# Patient Record
Sex: Female | Born: 1990 | Hispanic: Yes | Marital: Married | Smoking: Never smoker
Health system: Southern US, Community
[De-identification: ages and names within clinical notes are randomized; demographics above are authoritative.]

## PROBLEM LIST (undated history)

## (undated) DIAGNOSIS — J45909 Unspecified asthma, uncomplicated: Secondary | ICD-10-CM

## (undated) DIAGNOSIS — D649 Anemia, unspecified: Secondary | ICD-10-CM

## (undated) DIAGNOSIS — K219 Gastro-esophageal reflux disease without esophagitis: Secondary | ICD-10-CM

## (undated) HISTORY — PX: KNEE CARTILAGE SURGERY: SHX688

---

## 2018-03-27 DIAGNOSIS — O09219 Supervision of pregnancy with history of pre-term labor, unspecified trimester: Secondary | ICD-10-CM

## 2018-03-27 DIAGNOSIS — O09899 Supervision of other high risk pregnancies, unspecified trimester: Secondary | ICD-10-CM

## 2018-03-28 DIAGNOSIS — O0933 Supervision of pregnancy with insufficient antenatal care, third trimester: Secondary | ICD-10-CM

## 2018-05-29 NOTE — L&D Delivery Note (Signed)
Patient: Angela Huber MRN: [id number]  GBS status: Negative, IAP given: None   Patient is a 28 y.o. now G4P3 s/p NSVD at [redacted]w[redacted]d, who was admitted for IOL for decreased fetal movement. AROM 0h 35m prior to delivery with clear fluid.   Was called to patient's room for recurrent deep variable decels. Ruptured patient with plan to place IUPC for amnioinfusion. Noted to be 8 cm. Had a prolonged decel to 60-70 bpm lasting 5 minutes. HR returned to baseline with hands and knees, O2, and discontinuation of Pitocin. Re-checked patient and she was complete.    Delivery Note At 11:22 PM a viable female was delivered via Vaginal, Spontaneous (Presentation: OA).  APGAR: 8, 9; weight pending.   Placenta status: spontaneous, intact.  Cord: 3 vessel with the following complications: none.    Anesthesia:  Epidural  Episiotomy: None Lacerations: None Suture Repair: None Est. Blood Loss (mL): 109  Head delivered OA. No nuchal cord present. Shoulder and body delivered in usual fashion. Infant with spontaneous cry, placed on mother's abdomen, dried and bulb suctioned. Cord clamped x 2 after 1-minute delay, and cut by family member. Cord blood drawn. Placenta delivered spontaneously with gentle cord traction. Fundus firm with massage and Pitocin. Perineum, vagina and labia inspected and found to have no lacerations.  Mom to postpartum.  Baby to Couplet care / Skin to Skin.  De Hollingshead 07/31/2018, 11:46 PM

## 2018-07-31 ENCOUNTER — Encounter (HOSPITAL_COMMUNITY): Payer: Self-pay | Admitting: *Deleted

## 2018-07-31 ENCOUNTER — Inpatient Hospital Stay (HOSPITAL_COMMUNITY)
Admission: EM | Admit: 2018-07-31 | Discharge: 2018-08-02 | DRG: 807 | Disposition: A | Discharge status: Home / Self Care | Payer: Medicaid Other | Attending: Obstetrics and Gynecology | Admitting: Obstetrics and Gynecology

## 2018-07-31 ENCOUNTER — Inpatient Hospital Stay (HOSPITAL_COMMUNITY): Payer: Medicaid Other | Admitting: Anesthesiology

## 2018-07-31 ENCOUNTER — Other Ambulatory Visit: Payer: Self-pay

## 2018-07-31 DIAGNOSIS — O368130 Decreased fetal movements, third trimester, not applicable or unspecified: Principal | ICD-10-CM | POA: Diagnosis present

## 2018-07-31 DIAGNOSIS — E669 Obesity, unspecified: Secondary | ICD-10-CM | POA: Diagnosis present

## 2018-07-31 DIAGNOSIS — O9902 Anemia complicating childbirth: Secondary | ICD-10-CM | POA: Diagnosis present

## 2018-07-31 DIAGNOSIS — O365930 Maternal care for other known or suspected poor fetal growth, third trimester, not applicable or unspecified: Secondary | ICD-10-CM | POA: Diagnosis present

## 2018-07-31 DIAGNOSIS — D649 Anemia, unspecified: Secondary | ICD-10-CM | POA: Diagnosis present

## 2018-07-31 DIAGNOSIS — O0933 Supervision of pregnancy with insufficient antenatal care, third trimester: Secondary | ICD-10-CM

## 2018-07-31 DIAGNOSIS — Z3A40 40 weeks gestation of pregnancy: Secondary | ICD-10-CM

## 2018-07-31 DIAGNOSIS — O09899 Supervision of other high risk pregnancies, unspecified trimester: Secondary | ICD-10-CM

## 2018-07-31 DIAGNOSIS — O99824 Streptococcus B carrier state complicating childbirth: Secondary | ICD-10-CM | POA: Diagnosis present

## 2018-07-31 DIAGNOSIS — O99214 Obesity complicating childbirth: Secondary | ICD-10-CM | POA: Diagnosis present

## 2018-07-31 DIAGNOSIS — O09219 Supervision of pregnancy with history of pre-term labor, unspecified trimester: Secondary | ICD-10-CM

## 2018-07-31 DIAGNOSIS — O368190 Decreased fetal movements, unspecified trimester, not applicable or unspecified: Secondary | ICD-10-CM | POA: Diagnosis present

## 2018-07-31 HISTORY — DX: Unspecified asthma, uncomplicated: J45.909

## 2018-07-31 HISTORY — DX: Anemia, unspecified: D64.9

## 2018-07-31 HISTORY — DX: Gastro-esophageal reflux disease without esophagitis: K21.9

## 2018-07-31 LAB — URINALYSIS, ROUTINE W REFLEX MICROSCOPIC
Bilirubin Urine: NEGATIVE
Glucose, UA: NEGATIVE mg/dL
Hgb urine dipstick: NEGATIVE
Ketones, ur: NEGATIVE mg/dL
Nitrite: NEGATIVE
Protein, ur: NEGATIVE mg/dL
Specific Gravity, Urine: 1.027 (ref 1.005–1.030)
pH: 5 (ref 5.0–8.0)

## 2018-07-31 LAB — CBC
HEMATOCRIT: 29 % — AB (ref 36.0–46.0)
HEMOGLOBIN: 8.9 g/dL — AB (ref 12.0–15.0)
MCH: 23.9 pg — ABNORMAL LOW (ref 26.0–34.0)
MCHC: 30.7 g/dL (ref 30.0–36.0)
MCV: 77.7 fL — AB (ref 80.0–100.0)
Platelets: 282 10*3/uL (ref 150–400)
RBC: 3.73 MIL/uL — ABNORMAL LOW (ref 3.87–5.11)
RDW: 17.4 % — ABNORMAL HIGH (ref 11.5–15.5)
WBC: 11.3 10*3/uL — ABNORMAL HIGH (ref 4.0–10.5)
nRBC: 0 % (ref 0.0–0.2)

## 2018-07-31 LAB — TYPE AND SCREEN
ABO/RH(D): O POS
Antibody Screen: NEGATIVE

## 2018-07-31 LAB — WET PREP, GENITAL
Clue Cells Wet Prep HPF POC: NONE SEEN
Sperm: NONE SEEN
Trich, Wet Prep: NONE SEEN
Yeast Wet Prep HPF POC: NONE SEEN

## 2018-07-31 LAB — HEMOGLOBIN A1C
Hgb A1c MFr Bld: 5.3 % (ref 4.8–5.6)
Mean Plasma Glucose: 105.41 mg/dL

## 2018-07-31 LAB — GROUP B STREP BY PCR: GROUP B STREP BY PCR: NEGATIVE

## 2018-07-31 LAB — ABO/RH: ABO/RH(D): O POS

## 2018-07-31 MED ORDER — OXYTOCIN 40 UNITS IN NORMAL SALINE INFUSION - SIMPLE MED
2.5000 [IU]/h | INTRAVENOUS | Status: DC
  Administered 2018-07-31: 2.5 [IU]/h via INTRAVENOUS
  Filled 2018-07-31: qty 1000

## 2018-07-31 MED ORDER — FENTANYL-BUPIVACAINE-NACL 0.5-0.125-0.9 MG/250ML-% EP SOLN
12.0000 mL/h | EPIDURAL | Status: DC | PRN
  Filled 2018-07-31: qty 250

## 2018-07-31 MED ORDER — LIDOCAINE HCL (PF) 1 % IJ SOLN: 30.0000 mL | INTRAMUSCULAR | Status: DC | PRN

## 2018-07-31 MED ORDER — PHENYLEPHRINE 40 MCG/ML (10ML) SYRINGE FOR IV PUSH (FOR BLOOD PRESSURE SUPPORT): 80.0000 ug | PREFILLED_SYRINGE | INTRAVENOUS | Status: DC | PRN

## 2018-07-31 MED ORDER — OXYTOCIN 40 UNITS IN NORMAL SALINE INFUSION - SIMPLE MED
1.0000 m[IU]/min | INTRAVENOUS | Status: DC
  Administered 2018-07-31: 2 m[IU]/min via INTRAVENOUS

## 2018-07-31 MED ORDER — SODIUM BICARBONATE 8.4 % IV SOLN
INTRAVENOUS | Status: DC | PRN
  Administered 2018-07-31: 2 mL via EPIDURAL
  Administered 2018-07-31: 5 mL via EPIDURAL

## 2018-07-31 MED ORDER — LACTATED RINGERS IV SOLN: 500.0000 mL | INTRAVENOUS | Status: DC | PRN

## 2018-07-31 MED ORDER — SODIUM CHLORIDE (PF) 0.9 % IJ SOLN
INTRAMUSCULAR | Status: DC | PRN
  Administered 2018-07-31: 12 mL/h via EPIDURAL

## 2018-07-31 MED ORDER — TERBUTALINE SULFATE 1 MG/ML IJ SOLN: 0.2500 mg | Freq: Once | INTRAMUSCULAR | Status: DC | PRN

## 2018-07-31 MED ORDER — EPHEDRINE 5 MG/ML INJ: 10.0000 mg | INTRAVENOUS | Status: DC | PRN

## 2018-07-31 MED ORDER — DIPHENHYDRAMINE HCL 50 MG/ML IJ SOLN: 12.5000 mg | INTRAMUSCULAR | Status: DC | PRN

## 2018-07-31 MED ORDER — ONDANSETRON HCL 4 MG/2ML IJ SOLN: 4.0000 mg | Freq: Four times a day (QID) | INTRAMUSCULAR | Status: DC | PRN

## 2018-07-31 MED ORDER — OXYTOCIN BOLUS FROM INFUSION
500.0000 mL | Freq: Once | INTRAVENOUS | Status: AC
  Administered 2018-07-31: 500 mL via INTRAVENOUS

## 2018-07-31 MED ORDER — FENTANYL CITRATE (PF) 100 MCG/2ML IJ SOLN: 100.0000 ug | INTRAMUSCULAR | Status: DC | PRN

## 2018-07-31 MED ORDER — FLEET ENEMA 7-19 GM/118ML RE ENEM: 1.0000 | ENEMA | RECTAL | Status: DC | PRN

## 2018-07-31 MED ORDER — LACTATED RINGERS IV SOLN
INTRAVENOUS | Status: DC
  Administered 2018-07-31: 16:00:00 via INTRAVENOUS

## 2018-07-31 MED ORDER — SOD CITRATE-CITRIC ACID 500-334 MG/5ML PO SOLN: 30.0000 mL | ORAL | Status: DC | PRN

## 2018-07-31 MED ORDER — LACTATED RINGERS IV SOLN: 500.0000 mL | Freq: Once | INTRAVENOUS | Status: DC

## 2018-07-31 NOTE — H&P (Addendum)
OBSTETRIC ADMISSION HISTORY AND PHYSICAL  Angela Huber is a 28 y.o. female [phone] with IUP at [redacted]w[redacted]d presenting for decreased fetal movement since 07/30/2018 @ 2000. She reports +Fms x3-4 since her arrival in MAU and was also having pain 8/10 in intensity. She had limited prenatal care in [place].  Patient states that she experienced heavy vaginal bleeding August-September of 2019. Reports bleeding through 2 pads every 2 hours. Patient states that she had an ultrasound in the emergency department in [place], but states they did not inform her that anything was wrong. She was given medication (vaginal progesterone) to prevent PTL. They stopped them 1 month ago. No LOF, VB, blurry vision, headaches, peripheral edema, or RUQ pain. She plans on breast and formula feeding. She requests Nexplanon for birth control.  Dating: By USS in the first trimester --->  Estimated Date of Delivery: 07/31/18  Sono:    @[redacted]w[redacted]d , CWD, normal anatomy, Vertex presentation, 1132g, 31%ile, EFW 2lb 8oz   Prenatal History/Complications: First Pregnancy SVD at term 6lb 11oz Second pregnancy was preterm at approx. 32 weeks patient states delivery was due to placental abruption. 2lb 8oz  Third pregnancy was a miscarriage at 15 weeks of unknown cause   Past Medical History: Past Medical History:  Diagnosis Date  . Anemia   . Asthma   . GERD (gastroesophageal reflux disease)     Past Surgical History: Past Surgical History:  Procedure Laterality Date  . KNEE CARTILAGE SURGERY Right     Obstetrical History: OB History    Gravida  4   Para  2   Term  1   Preterm  1   AB  1   Living  2     SAB  1   TAB      Ectopic      Multiple      Live Births  2           Social History: Social History   Socioeconomic History  . Marital status: Married    Spouse name: Not on file  . Number of children: Not on file  . Years of education: Not on file  . Highest education level: Not on file  Occupational  History  . Not on file  Social Needs  . Financial resource strain: Not on file  . Food insecurity:    Worry: Not on file    Inability: Not on file  . Transportation needs:    Medical: Not on file    Non-medical: Not on file  Tobacco Use  . Smoking status: Never Smoker  . Smokeless tobacco: Never Used  Substance and Sexual Activity  . Alcohol use: Not Currently  . Drug use: Never  . Sexual activity: Not on file  Lifestyle  . Physical activity:    Days per week: Not on file    Minutes per session: Not on file  . Stress: Not on file  Relationships  . Social connections:    Talks on phone: Not on file    Gets together: Not on file    Attends religious service: Not on file    Active member of club or organization: Not on file    Attends meetings of clubs or organizations: Not on file    Relationship status: Not on file  Other Topics Concern  . Not on file  Social History Narrative  . Not on file    Family History: No family history on file.  Allergies: Allergies  Allergen Reactions  .  Sulfa Antibiotics Hives    Medications Prior to Admission  Medication Sig Dispense Refill Last Dose  . docusate sodium (COLACE) 100 MG capsule Take daily to help with side effects from iron therapy     . Doxylamine-Pyridoxine 10-10 MG TBEC Take by mouth.     . ferrous sulfate 325 (65 FE) MG tablet Take by mouth.     Marland Kitchen PRENATAL 28-0.8 MG TABS Take by mouth.     . progesterone (PROMETRIUM) 200 MG capsule 200 mg nightly per vagina        Review of Systems   All systems reviewed and negative except as stated in HPI  Blood pressure 138/81, pulse 79, temperature 98.7 F (37.1 C), temperature source Oral, resp. rate 16, height 5\' 3"  (1.6 m), weight 99.5 kg, last menstrual period 09/27/2017, SpO2 100 %. General appearance: alert, cooperative and no distress Lungs: regular rate and effort Heart: regular rate  Abdomen: soft, non-tender Extremities: Homans sign is negative, no sign of  DVT Presentation: Vertex on Leopold Fetal monitoringBaseline: 130 bpm, Variability: Good {> 6 bpm) and Accelerations: Reactive Uterine activityNone     Prenatal labs: Prenatal labs under care everywhere  ABO, Rh:  O Positive  Antibody:   Negative Rubella:   2.26 (03/28/2018) RPR:   Non Reactive  HBsAg:   Negative  HIV:   Non reactive  GBS:   PCR negative 07/31/2018 2 hr GTT unknown  GC/C: Negative (11/23/2017)  Prenatal Transfer Tool  Maternal Diabetes: Unknown  Genetic Screening: None on file  Maternal Ultrasounds/Referrals: None  Fetal Ultrasounds or other Referrals:  None Maternal Substance Abuse:  No Significant Maternal Medications:  Meds include: Progesterone Significant Maternal Lab Results: None  Results for orders placed or performed during the hospital encounter of 07/31/18 (from the past 24 hour(s))  Urinalysis, Routine w reflex microscopic   Collection Time: 07/31/18  2:00 PM  Result Value Ref Range   Color, Urine YELLOW YELLOW   APPearance CLOUDY (A) CLEAR   Specific Gravity, Urine 1.027 1.005 - 1.030   pH 5.0 5.0 - 8.0   Glucose, UA NEGATIVE NEGATIVE mg/dL   Hgb urine dipstick NEGATIVE NEGATIVE   Bilirubin Urine NEGATIVE NEGATIVE   Ketones, ur NEGATIVE NEGATIVE mg/dL   Protein, ur NEGATIVE NEGATIVE mg/dL   Nitrite NEGATIVE NEGATIVE   Leukocytes,Ua MODERATE (A) NEGATIVE   WBC, UA 21-50 0 - 5 WBC/hpf   Bacteria, UA MANY (A) NONE SEEN   Squamous Epithelial / LPF 11-20 0 - 5   Mucus PRESENT   Wet prep, genital   Collection Time: 07/31/18  2:55 PM  Result Value Ref Range   Yeast Wet Prep HPF POC NONE SEEN NONE SEEN   Trich, Wet Prep NONE SEEN NONE SEEN   Clue Cells Wet Prep HPF POC NONE SEEN NONE SEEN   WBC, Wet Prep HPF POC MANY (A) NONE SEEN   Sperm NONE SEEN     Patient Active Problem List   Diagnosis Date Noted  . Decreased fetal movement 07/31/2018  . Obesity 07/31/2018  . Insufficient prenatal care in third trimester 03/28/2018  . History of  preterm delivery, currently pregnant 03/27/2018    Assessment: Angela Huber is a 28 y.o. [id number] at [redacted]w[redacted]d here for Decreased Fetal movement, with Hx of preterm delivery and miscarriage and GBS+ history stated by patient. USS confirm normal growth until 29 weeks but is currently measuring at 35cm at [redacted] wk GA by USS scan with EFW at last scan at 27 weeks in  the 31% and she also states Hx of GBS in her first pregnancy.  She currently has a negative GBS PCR. And there is non Trich or clue cells on wet prep just numerous WBCs.  IUGR Inadequate Prenatal care  Hx of prematurity.    1. Labor: Not in labour no contractions on toco.  2. FWB: Cat 1  3. Pain: None  4. GBS: PCR negative    Plan: Plan is to order CBC, RPR, HbA1c, Type and screen, UDS IOL do to term IUGR  Cervical check for bishop score (Pit vs Cytotec)   Sandi Raveling, MD  07/31/2018, 3:48 PM   I confirm that I have verified the information documented in the resident's note and that I have also personally reperformed the history, physical exam and all medical decision making activities of this service and have verified that all service and findings are accurately documented in this student's note.   Lab results reviewed under care everywhere from October 2019  GBS by PCR pending   Sharyon Cable, CNM 07/31/2018 7:33 PM

## 2018-07-31 NOTE — Progress Notes (Signed)
Labor Progress Note Angela Huber is a 28 y.o. female [phone] with IUP at [redacted]w[redacted]d for IOL presenting for decreased fetal movement since 07/30/2018 @ 2000. She reports +Fms x3-4 since her arrival in MAU and was also having pain 8/10 in intensity. She had limited prenatal care in [place] and on last anatomy USS showed SGA with EFW 1132g, 31% at 28+1 wk FH is currently 35cm small for dates.   S:  She is currently comfortable and is not having any HA, VB, LOF, and no RUQ pain and no peripheral edema. Not feeling any contractions   O:  BP 138/81 (BP Location: Left Arm)   Pulse 79   Temp 98.7 F (37.1 C) (Oral)   Resp 16   Ht 5\' 3"  (1.6 m)   Wt 99.5 kg   LMP 09/27/2017   SpO2 100%   BMI 38.85 kg/m  EFM: baseline 140 bpm/ Mod variability/ + accels/ - decels  Toco: Irritable  SVE: Dilation: 3.5 Effacement (%): 50 Station: -2 Presentation: Vertex Exam by:: Lennox Pippins resident  Pitocin: 2 mu/min  A/P:Angela Huber is a 28 y.o. female [phone] with IUP at [redacted]w[redacted]d GBS negative for IOL presenting for decreased fetal movement since 07/30/2018 @ 2000. She reports +Fms x3-4 since her arrival in MAU and was also having pain 8/10 in intensity. She had limited prenatal care in [place] and on last anatomy USS showed SGA with EFW 1132g, 31% at 28+1 wk FH 35 cm today small for dates  #SGA  USS showed SGA with EFW 1132g, 31% at 28+1 wk. She is currently at term and so we will go ahead and induce for delivery. FWB looks good at the moment and she has not labs currently that indicate any intrauterine infections that could cause growth restriction. There is possibility of having a placental factor be the culprit.   #Anemia: 8.9 Hgb with MCV of 77.7 fL Supplementation of iron after birth and then trend hemoglobin PP.  #Decreased fetal movement: This has since resolved and she is feeling active kicks and this was also felt on physical examination of patient.   1. Labor: Early latent  2. FWB: Cat 1 3. Pain: Mild    -Anticipate Vaginal delivery with induction of labour with favorable Bishop of 7 (3.5cm, 50%, -2) -Start pitocin low dose 2 mu/min to titrate by 2 mu/hr -Continue expectant management  -Serial cervical check at approx 2300hrs  Sandi Raveling, MD PGY-1 Family Medicine Resident [facility]  6:46 PM

## 2018-07-31 NOTE — MAU Note (Signed)
Started contracting yesterday, mild today q 15-62min.  "she has not been moving at all".  Hasn't felt any movement since last night.  No bleeding or leaking.  Moved here from [place] a wk ago.  Was on meds for PTL, vaginal insert- they stopped them a wk ago.

## 2018-07-31 NOTE — Anesthesia Procedure Notes (Signed)
Epidural Patient location during procedure: OB Start time: 07/31/2018 9:06 PM End time: 07/31/2018 9:19 PM  Staffing Anesthesiologist: Lucretia Kern, MD Performed: anesthesiologist   Preanesthetic Checklist Completed: patient identified, pre-op evaluation, timeout performed, IV checked, risks and benefits discussed and monitors and equipment checked  Epidural Patient position: sitting Prep: DuraPrep Patient monitoring: heart rate, continuous pulse ox and blood pressure Approach: midline Location: L3-L4 Injection technique: LOR air  Needle:  Needle type: Tuohy  Needle gauge: 17 G Needle length: 9 cm Needle insertion depth: 5.5 cm Catheter type: closed end flexible Catheter size: 19 Gauge Catheter at skin depth: 11 cm  Assessment Events: blood not aspirated, injection not painful, no injection resistance, negative IV test and no paresthesia  Additional Notes Reason for block:procedure for pain

## 2018-07-31 NOTE — MAU Note (Signed)
Pt did have vaginal bleeding during pregnancy that was heavy, no ultrasound done that she recalls and was never hospitalized for it. Will try to send for medical records.

## 2018-07-31 NOTE — Anesthesia Pain Management Evaluation Note (Signed)
  CRNA Pain Management Visit Note  Patient: Angela Huber, 28 y.o., female  "Hello I am a member of the anesthesia team at [facility]. We have an anesthesia team available at all times to provide care throughout the hospital, including epidural management and anesthesia for C-section. I don't know your plan for the delivery whether it a natural birth, water birth, IV sedation, nitrous supplementation, doula or epidural, but we want to meet your pain goals."   1.Was your pain managed to your expectations on prior hospitalizations?   Yes   2.What is your expectation for pain management during this hospitalization?     Epidural  3.How can we help you reach that goal? Maintain epidural until delivery of infant.  Record the patient's initial score and the patient's pain goal.   Pain: 0  Pain Goal: 0 [facility] wants you to be able to say your pain was always managed very well.  Ithzel Fedorchak 07/31/2018

## 2018-07-31 NOTE — Anesthesia Preprocedure Evaluation (Signed)
Anesthesia Evaluation  Patient identified by MRN, date of birth, ID band Patient awake    Reviewed: Allergy & Precautions, H&P , NPO status , Patient's Chart, lab work & pertinent test results  History of Anesthesia Complications Negative for: history of anesthetic complications  Airway Mallampati: II  TM Distance: >3 FB Neck ROM: full    Dental no notable dental hx.    Pulmonary asthma ,    Pulmonary exam normal        Cardiovascular negative cardio ROS Normal cardiovascular exam Rhythm:regular Rate:Normal     Neuro/Psych negative neurological ROS  negative psych ROS   GI/Hepatic Neg liver ROS, GERD  ,  Endo/Other  negative endocrine ROS  Renal/GU negative Renal ROS  negative genitourinary   Musculoskeletal   Abdominal   Peds  Hematology negative hematology ROS (+) Blood dyscrasia, anemia ,   Anesthesia Other Findings   Reproductive/Obstetrics (+) Pregnancy                             Anesthesia Physical Anesthesia Plan  ASA: II  Anesthesia Plan: Epidural   Post-op Pain Management:    Induction:   PONV Risk Score and Plan:   Airway Management Planned:   Additional Equipment:   Intra-op Plan:   Post-operative Plan:   Informed Consent: I have reviewed the patients History and Physical, chart, labs and discussed the procedure including the risks, benefits and alternatives for the proposed anesthesia with the patient or authorized representative who has indicated his/her understanding and acceptance.       Plan Discussed with:   Anesthesia Plan Comments:         Anesthesia Quick Evaluation

## 2018-07-31 NOTE — MAU Provider Note (Addendum)
History    CSN: [id number]  Arrival date and time: 07/31/18 1318   First Provider Initiated Contact with Patient 07/31/18 1413     History obtained from the patient.   Chief Complaint  Patient presents with  . Decreased Fetal Movement  . Contractions   HPI   Angela Huber is a 28 year old female [phone] who presents to MAU with a chief complaint of decreased fetal movement since 8:00 pm last night. Fetal heart tones were obtained in MAU (137 bmp). Patient has felt the baby move 3-4 times since she has been here. Patient also endorses constant suprapubic pain and pressure that she describes as a "cramp." She is unsure if she is experiencing contractions. Rates pain 8/10. Patient has not taken anything for the pain. Limited prenatal care in [place] (1 visit) Patient denies bleeding, leaking, or vaginal discharge. Patient is taking prenatal vitamins.   Patient states that she experienced heavy vaginal bleeding August-September of 2019. Reports bleeding through 2 pads every 2 hours. Patient states that she had an ultrasound in the emergency department in [place], but states they did not inform her that anything was wrong. She was given medication (vaginal insert) to prevent PTL. Patient can't recall the name of the medication They stopped them 1 month ago.    Patient had 1 term baby, 1 preterm baby (32 weeks; 2lbs 8 oz), and 1 miscarriage in early 2019.  OB History    Gravida  4   Para  2   Term  1   Preterm  1   AB  1   Living  2     SAB  1   TAB      Ectopic      Multiple      Live Births  2           Past Medical History:  Diagnosis Date  . Anemia   . Asthma   . GERD (gastroesophageal reflux disease)     Past Surgical History:  Procedure Laterality Date  . KNEE CARTILAGE SURGERY Right     No family history on file.  Social History   Tobacco Use  . Smoking status: Never Smoker  . Smokeless tobacco: Never Used  Substance Use Topics  . Alcohol use: Not  Currently  . Drug use: Never    Allergies:  Allergies  Allergen Reactions  . Sulfa Antibiotics Hives    No medications prior to admission.    Review of Systems  Constitutional: Negative for chills and fever.  Respiratory: Negative for shortness of breath.   Cardiovascular: Positive for leg swelling. Negative for chest pain.  Gastrointestinal: Positive for abdominal pain (suprapubic). Negative for constipation, diarrhea, nausea and vomiting.  Genitourinary: Negative for dysuria, vaginal bleeding, vaginal discharge and vaginal pain.  Neurological: Negative for dizziness and headaches.   Physical Exam   Blood pressure 128/78, pulse 84, temperature 99.1 F (37.3 C), temperature source Oral, resp. rate 16, height 5\' 3"  (1.6 m), weight 99.5 kg, SpO2 100 %.  Physical Exam  Constitutional: She is oriented to person, place, and time. She appears well-developed and well-nourished.  HENT:  Head: Normocephalic and atraumatic.  Neck: Normal range of motion. Neck supple.  Cardiovascular: Normal rate, regular rhythm and normal heart sounds.  Respiratory: Effort normal and breath sounds normal.  GI: Bowel sounds are normal. There is no abdominal tenderness. There is no guarding.  FH 35cm  Genitourinary:    Genitourinary Comments: SVE 3.5/60/-2, vtx   Musculoskeletal:  Normal range of motion.        General: Edema ( bilateral pitting edema 1+) present.  Neurological: She is alert and oriented to person, place, and time.  Skin: Skin is warm and dry.  Psychiatric: She has a normal mood and affect. Her behavior is normal. Judgment and thought content normal.  EFM: 135 bpm, mod variability, + accels, variable x1  Toco: irregular  Johney Maine PA-S2 07/31/2018, 2:14 PM  Results for orders placed or performed during the hospital encounter of 07/31/18 (from the past 24 hour(s))  Urinalysis, Routine w reflex microscopic     Status: Abnormal   Collection Time: 07/31/18  2:00 PM  Result  Value Ref Range   Color, Urine YELLOW YELLOW   APPearance CLOUDY (A) CLEAR   Specific Gravity, Urine 1.027 1.005 - 1.030   pH 5.0 5.0 - 8.0   Glucose, UA NEGATIVE NEGATIVE mg/dL   Hgb urine dipstick NEGATIVE NEGATIVE   Bilirubin Urine NEGATIVE NEGATIVE   Ketones, ur NEGATIVE NEGATIVE mg/dL   Protein, ur NEGATIVE NEGATIVE mg/dL   Nitrite NEGATIVE NEGATIVE   Leukocytes,Ua MODERATE (A) NEGATIVE   WBC, UA 21-50 0 - 5 WBC/hpf   Bacteria, UA MANY (A) NONE SEEN   Squamous Epithelial / LPF 11-20 0 - 5   Mucus PRESENT    MAU Course  Procedures  MDM Chart review: one PN visit (care everywhere). Records obtained that confirms EDC of 07/31/18 by 7w [place]. Ultrasound reports received confirm normal growth until 29 weeks but FH today is 35 cm. Given reports of no FM, limited, PNC, term gestation, and favorable cervix will admit for IOL. Discussed with Dr. Ashok Pall.  Assessment and Plan  [redacted] weeks gestation Reactive NST Admit to LD IOL Mngt per labor team

## 2018-08-01 ENCOUNTER — Encounter (HOSPITAL_COMMUNITY): Payer: Self-pay

## 2018-08-01 LAB — RUBELLA SCREEN: Rubella: 1.88 index (ref >0.99)

## 2018-08-01 LAB — GC/CHLAMYDIA PROBE AMP (~~LOC~~) NOT AT ARMC
Chlamydia: NEGATIVE
NEISSERIA GONORRHEA: NEGATIVE

## 2018-08-01 LAB — HIV ANTIBODY (ROUTINE TESTING W REFLEX): HIV Screen 4th Generation wRfx: NONREACTIVE

## 2018-08-01 LAB — RPR: RPR Ser Ql: NONREACTIVE

## 2018-08-01 MED ORDER — ONDANSETRON HCL 4 MG PO TABS: 4.0000 mg | ORAL_TABLET | ORAL | Status: DC | PRN

## 2018-08-01 MED ORDER — SIMETHICONE 80 MG PO CHEW: 80.0000 mg | CHEWABLE_TABLET | ORAL | Status: DC | PRN

## 2018-08-01 MED ORDER — ONDANSETRON HCL 4 MG/2ML IJ SOLN: 4.0000 mg | INTRAMUSCULAR | Status: DC | PRN

## 2018-08-01 MED ORDER — ACETAMINOPHEN 325 MG PO TABS: 650.0000 mg | ORAL_TABLET | ORAL | Status: DC | PRN

## 2018-08-01 MED ORDER — SENNOSIDES-DOCUSATE SODIUM 8.6-50 MG PO TABS
2.0000 | ORAL_TABLET | ORAL | Status: DC
  Administered 2018-08-01: 2 via ORAL
  Filled 2018-08-01: qty 2

## 2018-08-01 MED ORDER — PRENATAL MULTIVITAMIN CH
1.0000 | ORAL_TABLET | Freq: Every day | ORAL | Status: DC
  Administered 2018-08-01: 1 via ORAL
  Filled 2018-08-01: qty 1

## 2018-08-01 MED ORDER — TETANUS-DIPHTH-ACELL PERTUSSIS 5-2.5-18.5 LF-MCG/0.5 IM SUSP: 0.5000 mL | Freq: Once | INTRAMUSCULAR | Status: DC

## 2018-08-01 MED ORDER — IBUPROFEN 600 MG PO TABS
600.0000 mg | ORAL_TABLET | Freq: Four times a day (QID) | ORAL | Status: DC
  Administered 2018-08-01 (×4): 600 mg via ORAL
  Filled 2018-08-01 (×6): qty 1

## 2018-08-01 MED ORDER — DIBUCAINE 1 % RE OINT: 1.0000 "application " | TOPICAL_OINTMENT | RECTAL | Status: DC | PRN

## 2018-08-01 MED ORDER — ZOLPIDEM TARTRATE 5 MG PO TABS: 5.0000 mg | ORAL_TABLET | Freq: Every evening | ORAL | Status: DC | PRN

## 2018-08-01 MED ORDER — DIPHENHYDRAMINE HCL 25 MG PO CAPS: 25.0000 mg | ORAL_CAPSULE | Freq: Four times a day (QID) | ORAL | Status: DC | PRN

## 2018-08-01 MED ORDER — WITCH HAZEL-GLYCERIN EX PADS: 1.0000 "application " | MEDICATED_PAD | CUTANEOUS | Status: DC | PRN

## 2018-08-01 MED ORDER — MEASLES, MUMPS & RUBELLA VAC IJ SOLR: 0.5000 mL | Freq: Once | INTRAMUSCULAR | Status: DC

## 2018-08-01 MED ORDER — BENZOCAINE-MENTHOL 20-0.5 % EX AERO: 1.0000 "application " | INHALATION_SPRAY | CUTANEOUS | Status: DC | PRN

## 2018-08-01 MED ORDER — COCONUT OIL OIL: 1.0000 "application " | TOPICAL_OIL | Status: DC | PRN

## 2018-08-01 NOTE — Lactation Note (Signed)
This note was copied from a baby's chart. Lactation Consultation Note  Patient Name: Angela Huber OIZTI'W Date: 08/01/2018 Reason for consult: Initial assessment;Infant < 6lbs   P3, Baby 11 hours old. < 6 lbs. Mother first child < 3 lbs and she pumped for 6 mos and 2nd baby did not latch so she also pumped. Reviewed hand expression w/ good flow of colostrum.  Spoon fed baby 2 ml. Reviewed LPI volume feeding guidelines.  Recommend increasing per day of life and as baby desires. Baby recently received approx 15 ml of formula. Baby latched in cross cradle hold with intermittent sucks and swallows.] Encouraged mother to continue to pump w/ DEBP q 3 hours. Mom made aware of O/P services, breastfeeding support groups, community resources, and our phone # for post-discharge questions.       Maternal Data Has patient been taught Hand Expression?: Yes Does the patient have breastfeeding experience prior to this delivery?: Yes  Feeding Feeding Type: Breast Fed  LATCH Score Latch: Repeated attempts needed to sustain latch, nipple held in mouth throughout feeding, stimulation needed to elicit sucking reflex.  Audible Swallowing: A few with stimulation  Type of Nipple: Everted at rest and after stimulation  Comfort (Breast/Nipple): Soft / non-tender  Hold (Positioning): Assistance needed to correctly position infant at breast and maintain latch.  LATCH Score: 7  Interventions Interventions: Breast feeding basics reviewed;Assisted with latch;Skin to skin;Hand express;DEBP  Lactation Tools Discussed/Used     Consult Status Consult Status: Follow-up Date: 08/02/18 Follow-up type: In-patient    Dahlia Byes [facility] 08/01/2018, 11:08 AM

## 2018-08-01 NOTE — Lactation Note (Signed)
This note was copied from a baby's chart. Lactation Consultation Note  Patient Name: Angela Huber Today's Date: 08/01/2018   [facility] entered room to find mom and dad sleeping soundly in bed. Per [facility], baby in nursery under warmer for low temps.  Virgia Land 08/01/2018, 3:38 AM

## 2018-08-01 NOTE — Discharge Summary (Deleted)
Obstetric Discharge Summary Reason for Admission: onset of labor Prenatal Procedures: none Intrapartum Procedures: spontaneous vaginal delivery Postpartum Procedures: none Complications-Operative and Postpartum: none Hemoglobin  Date Value Ref Range Status  07/31/2018 8.9 (L) 12.0 - 15.0 g/dL Final    Comment:    Reticulocyte Hemoglobin testing may be clinically indicated, consider ordering this additional test [id number]    HCT  Date Value Ref Range Status  07/31/2018 29.0 (L) 36.0 - 46.0 % Final    Physical Exam:  General: alert, cooperative, appears stated age and no distress Lochia: appropriate Uterine Fundus: firm Incision: n/a DVT Evaluation: No evidence of DVT seen on physical exam. Negative Homan's sign. No cords or calf tenderness. No significant calf/ankle edema.  Discharge Diagnoses: Term Pregnancy-delivered and limited prenatal care  Discharge Information: Date: 08/01/2018 Activity: unrestricted Diet: routine Medications: None Condition: stable Instructions: refer to practice specific booklet Discharge to: home   Newborn Data: Live born female  Birth Weight: 5 lb 14.4 oz (2676 g) APGAR: 8, 9  Newborn Delivery   Birth date/time:  07/31/2018 23:22:00 Delivery type:  Vaginal, Spontaneous     Home with mother.  Brand Males, SNM 08/01/2018, 12:03 PM

## 2018-08-01 NOTE — H&P (Signed)
Mom came in as breast and bottle. Baby is less than 6lbs and needs to supplement with similca neosure 22. Mom request a bottle because she states that her other two girls did not latch on well and ended up bottlefeeding. RN encouraged mom to place baby on the breast before bottle feeding and educated of the possible consequences of bottle feeding.  The possible consequences shared with patient include 1) Loss of confidence in breastfeeding 2) Engorgement 3) Allergic sensitization of baby(asthma/allergies) and 4) decreased milk supply for mother. similac Neosure 22 given with proper instructions on the amount that should be given based on the baby's age.mom verbalized understanding.

## 2018-08-01 NOTE — Progress Notes (Addendum)
Post Partum Day 1 Subjective: no complaints, up ad lib, voiding, tolerating PO and + flatus  Objective: Blood pressure 122/70, pulse 71, temperature 98.6 F (37 C), temperature source Oral, resp. rate 16, height 5\' 3"  (1.6 m), weight 99.5 kg, last menstrual period 09/27/2017, SpO2 100 %, unknown if currently breastfeeding.  Physical Exam:  General: alert, cooperative, appears stated age and no distress Lochia: appropriate Uterine Fundus: firm Incision: n/a DVT Evaluation: No evidence of DVT seen on physical exam. Negative Homan's sign. No cords or calf tenderness. No significant calf/ankle edema.  Recent Labs    07/31/18 1611  HGB 8.9*  HCT 29.0*    Assessment/Plan: Plan for discharge tomorrow and Breastfeeding   LOS: 1 day   Angela Huber, SNM 08/01/2018, 1:42 PM    I confirm that I have verified the information documented in the nurse midwife student's note and that I have also personally performed the history, physical exam and all medical decision making activities of this service and have verified that all service and findings are accurately documented in this student's note.  Patient doing well, desires Nexplanon. Will send msg to clinic to make sure she has PP visit set up.   [name], CNM 08/01/2018 1:45 PM

## 2018-08-01 NOTE — Plan of Care (Signed)
Parent request formula to supplement breast feeding due to  inability of baby to sustain latch, mom worried not getting enough.  Parents have been informed of small tummy size of newborn, taught hand expression and understand the possible consequences of formula to the health of the infant. The possible consequences shared with patient include 1) Loss of confidence in breastfeeding 2) Engorgement 3) Allergic sensitization of baby(asthma/allergies) and 4) decreased milk supply for mother.After discussion of the above the mother decided to supplement with EBM [ set up with hand pump and DEBP ] and 22cal similac due to baby's low birth weight.The tool used to give formula supplement will be abottle with slow flow nipple. Mother request formula to supplement breast milk.  Mom now plans to pump and bottle feed and not put baby to breast. Supplies given and mom began pumping.

## 2018-08-01 NOTE — Anesthesia Postprocedure Evaluation (Signed)
Anesthesia Post Note  Patient: Angela Huber  Procedure(s) Performed: AN AD HOC LABOR EPIDURAL     Patient location during evaluation: Mother Baby Anesthesia Type: Epidural Level of consciousness: awake and alert Pain management: pain level controlled Vital Signs Assessment: post-procedure vital signs reviewed and stable Respiratory status: spontaneous breathing, nonlabored ventilation and respiratory function stable Cardiovascular status: stable Postop Assessment: no headache, no backache, epidural receding, patient able to bend at knees and no apparent nausea or vomiting Anesthetic complications: no    Last Vitals:  Vitals:   08/01/18 1000 08/01/18 1345  BP: 122/70 119/71  Pulse: 71 64  Resp: 16 16  Temp: 37 C 37 C  SpO2:      Last Pain:  Vitals:   08/01/18 1345  TempSrc: Oral  PainSc:    Pain Goal: Patients Stated Pain Goal: 2 (08/01/18 1000)                 Rica Records

## 2018-08-02 MED ORDER — IBUPROFEN 600 MG PO TABS: 600.0000 mg | ORAL_TABLET | Freq: Four times a day (QID) | ORAL | 0 refills | Status: DC

## 2018-08-02 MED ORDER — SENNOSIDES-DOCUSATE SODIUM 8.6-50 MG PO TABS: 2.0000 | ORAL_TABLET | ORAL | 0 refills | Status: AC

## 2018-08-02 MED ORDER — TETANUS-DIPHTH-ACELL PERTUSSIS 5-2.5-18.5 LF-MCG/0.5 IM SUSP: 0.5000 mL | Freq: Once | INTRAMUSCULAR | 0 refills | Status: AC

## 2018-08-02 NOTE — Discharge Instructions (Signed)

## 2018-08-02 NOTE — Lactation Note (Signed)
This note was copied from a baby's chart. Lactation Consultation Note  Patient Name: Girl Analiza Fien [id number] Date: 08/02/2018  Mom reports she feels prepared to go home.  This is her 3rd girl.  Mom reports her breastpump got delivered today so she will have a way to pump when she goes home. Urged to breastfeed first.  Pump and feed back all Expressed mothers milk due to infants weight/IUGR/SGA.  Urged mom to d/c pacifier past feedings and make sure infant not still hungry. Mom to give formula as prescribed or by moms choice.  Mom has lactation services information for discharge. Urged mom to call lactation as needed.   Maternal Data    Feeding Feeding Type: Bottle Fed - Formula  LATCH Score                   Interventions    Lactation Tools Discussed/Used     Consult Status      Konica Stankowski Michaelle Copas 08/02/2018, 11:43 AM

## 2018-08-02 NOTE — Discharge Summary (Addendum)
Postpartum Discharge Summary     Patient Name: Angela Huber DOB: [birth year] MRN: [id number]  Date of admission: 07/31/2018 Delivering Provider: Arvilla Market   Date of discharge: 08/02/2018  Admitting diagnosis: due date is today,CTX Intrauterine pregnancy: [redacted]w[redacted]d     Secondary diagnosis:  Active Problems:   History of preterm delivery, currently pregnant   Insufficient prenatal care in third trimester   Decreased fetal movement   Obesity  Additional problems: None     Discharge diagnosis: Term Pregnancy Delivered                                                                                                Post partum procedures:None  Augmentation: AROM and Pitocin  Complications: None  Hospital course:  Induction of Labor With Vaginal Delivery   28 y.o. yo [phone] at [redacted]w[redacted]d was admitted to the hospital 07/31/2018 for induction of labor.  Indication for induction: decreased fetal movements.  Patient had an uncomplicated labor course as follows: Membrane Rupture Time/Date: 11:00 PM ,07/31/2018   Intrapartum Procedures: Episiotomy: None [1]                                         Lacerations:  None [1]  Patient had delivery of a Viable infant.  Information for the patient's newborn:  Kaydra, Gellner [[id number]]  Delivery Method: Vaginal, Spontaneous(Filed from Delivery Summary)   07/31/2018  Details of delivery can be found in separate delivery note.  Patient had a routine postpartum course. Patient is discharged home 08/02/18.  Magnesium Sulfate recieved: No BMZ received: No  Physical exam  Vitals:   08/01/18 1000 08/01/18 1345 08/01/18 2225 08/02/18 0526  BP: 122/70 119/71 138/82 113/68  Pulse: 71 64 78 79  Resp: 16 16 18 18   Temp: 98.6 F (37 C) 98.6 F (37 C) 98.4 F (36.9 C) 98 F (36.7 C)  TempSrc: Oral Oral Oral   SpO2:    100%  Weight:      Height:       General: alert, cooperative and no distress Lochia: appropriate Uterine Fundus:  firm Incision: N/A DVT Evaluation: No evidence of DVT seen on physical exam. No cords or calf tenderness. Calf/Ankle edema is present Labs: Lab Results  Component Value Date   WBC 11.3 (H) 07/31/2018   HGB 8.9 (L) 07/31/2018   HCT 29.0 (L) 07/31/2018   MCV 77.7 (L) 07/31/2018   PLT 282 07/31/2018   No flowsheet data found.  Discharge instruction: per After Visit Summary and "Baby and Me Booklet".  After visit meds:  Allergies as of 08/02/2018      Reactions   Sulfa Antibiotics Hives      Medication List    TAKE these medications   ibuprofen 600 MG tablet Commonly known as:  ADVIL,MOTRIN Take 1 tablet (600 mg total) by mouth every 6 (six) hours.   prenatal multivitamin Tabs tablet Take 1 tablet by mouth daily at 12 noon.   senna-docusate 8.6-50 MG tablet Commonly known  as:  Senokot-S Take 2 tablets by mouth daily. Start taking on:  August 03, 2018   Tdap 5-2.5-18.5 LF-MCG/0.5 injection Commonly known as:  BOOSTRIX Inject 0.5 mLs into the muscle once for 1 dose.       Diet: low salt diet  Activity: Advance as tolerated. Pelvic rest for 6 weeks.   Outpatient follow up:4 weeks Follow up Appt:No future appointments. Follow up Visit: Follow-up Information    Center for [facility]. Schedule an appointment as soon as possible for a visit in 4 week(s).   Specialty:  Obstetrics and Gynecology Why:  for postpartum visit Contact information: [address] [place] [address] [phone]          Please schedule this patient for Postpartum visit in: 4 weeks with the following provider: Any provider For C/S patients schedule nurse incision check in weeks 2 weeks: no High risk pregnancy complicated by: Previous preterm delivery, history of placental abruption Delivery mode:  SVD Anticipated Birth Control:  Nexplanon PP Procedures needed: None  Schedule Integrated BH visit: no  Newborn Data: Live born female  Birth Weight: 5 lb  14.4 oz (2676 g) APGAR: 8, 9  Newborn Delivery   Birth date/time:  07/31/2018 23:22:00 Delivery type:  Vaginal, Spontaneous     Baby Feeding: Bottle and Breast Disposition:home with mother   08/02/2018 Joana Reamer, DO  OB FELLOW DISCHARGE ATTESTATION  I have seen and examined this patient and agree with above documentation in the resident's note.   Gwenevere Abbot, MD  OB Fellow  08/02/2018, 11:58 AM

## 2018-08-05 ENCOUNTER — Telehealth: Payer: Self-pay | Admitting: Family Medicine

## 2018-08-05 NOTE — Telephone Encounter (Signed)
Attempted to call patient to get her scheduled for her postpartum visit. No answer, LVM with appt time and date. Also mailed a reminder letter. Advised to give the office a call back if needing to reschedule.

## 2018-08-12 ENCOUNTER — Ambulatory Visit: Payer: Medicaid Other | Admitting: Obstetrics and Gynecology

## 2018-08-15 ENCOUNTER — Emergency Department (HOSPITAL_COMMUNITY)
Admission: EM | Admit: 2018-08-15 | Discharge: 2018-08-16 | Disposition: A | Discharge status: Other Acute Inpatient Hospital | Payer: Medicaid Other | Attending: Emergency Medicine | Admitting: Emergency Medicine

## 2018-08-15 DIAGNOSIS — F53 Postpartum depression: Secondary | ICD-10-CM | POA: Insufficient documentation

## 2018-08-15 DIAGNOSIS — J45909 Unspecified asthma, uncomplicated: Secondary | ICD-10-CM | POA: Insufficient documentation

## 2018-08-15 DIAGNOSIS — O99345 Other mental disorders complicating the puerperium: Secondary | ICD-10-CM

## 2018-08-15 DIAGNOSIS — R45851 Suicidal ideations: Secondary | ICD-10-CM | POA: Diagnosis not present

## 2018-08-15 LAB — RAPID URINE DRUG SCREEN, HOSP PERFORMED
Amphetamines: NOT DETECTED
Barbiturates: NOT DETECTED
Benzodiazepines: NOT DETECTED
Cocaine: NOT DETECTED
Opiates: NOT DETECTED
Tetrahydrocannabinol: NOT DETECTED

## 2018-08-15 LAB — I-STAT BETA HCG BLOOD, ED (MC, WL, AP ONLY)

## 2018-08-15 LAB — COMPREHENSIVE METABOLIC PANEL
ALT: 71 U/L — ABNORMAL HIGH (ref 0–44)
AST: 54 U/L — ABNORMAL HIGH (ref 15–41)
Albumin: 3.7 g/dL (ref 3.5–5.0)
Alkaline Phosphatase: 118 U/L (ref 38–126)
Anion gap: 8 (ref 5–15)
BUN: 14 mg/dL (ref 6–20)
CO2: 22 mmol/L (ref 22–32)
Calcium: 8.8 mg/dL — ABNORMAL LOW (ref 8.9–10.3)
Chloride: 111 mmol/L (ref 98–111)
Creatinine, Ser: 0.74 mg/dL (ref 0.44–1.00)
GFR calc Af Amer: >60 mL/min (ref >60)
GFR calc non Af Amer: >60 mL/min (ref >60)
Glucose, Bld: 90 mg/dL (ref 70–99)
Potassium: 3.6 mmol/L (ref 3.5–5.1)
Sodium: 141 mmol/L (ref 135–145)
Total Bilirubin: 1 mg/dL (ref 0.3–1.2)
Total Protein: 7.9 g/dL (ref 6.5–8.1)

## 2018-08-15 LAB — CBC
HCT: 34.8 % — ABNORMAL LOW (ref 36.0–46.0)
HEMOGLOBIN: 10.4 g/dL — AB (ref 12.0–15.0)
MCH: 24 pg — ABNORMAL LOW (ref 26.0–34.0)
MCHC: 29.9 g/dL — ABNORMAL LOW (ref 30.0–36.0)
MCV: 80.4 fL (ref 80.0–100.0)
Platelets: 339 10*3/uL (ref 150–400)
RBC: 4.33 MIL/uL (ref 3.87–5.11)
RDW: 16.8 % — ABNORMAL HIGH (ref 11.5–15.5)
WBC: 8.4 10*3/uL (ref 4.0–10.5)
nRBC: 0 % (ref 0.0–0.2)

## 2018-08-15 LAB — ACETAMINOPHEN LEVEL: Acetaminophen (Tylenol), Serum: <10 ug/mL — ABNORMAL LOW (ref 10–30)

## 2018-08-15 LAB — SALICYLATE LEVEL: Salicylate Lvl: <7 mg/dL (ref 2.8–30.0)

## 2018-08-15 LAB — ETHANOL: Alcohol, Ethyl (B): <10 mg/dL (ref <10)
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
## 2018-08-15 NOTE — ED Triage Notes (Signed)
Pt reports suicidal thought and plan.  Pt reports she gave birth two weeks ago, has happened w/ the first two children as well.  Husband brought wife in concerned, reports mood changes, anger bursts, tearful episodes.  Has thought about going to kitchen and grabbing a knife to hurt herself.  Is home a lot w/ the the three young children alone while spouse works.

## 2018-08-15 NOTE — ED Notes (Signed)
TTS in process 

## 2018-08-16 ENCOUNTER — Inpatient Hospital Stay (HOSPITAL_COMMUNITY)
Admission: AD | Admit: 2018-08-16 | Discharge: 2018-08-17 | DRG: 885 | Disposition: A | Discharge status: Home / Self Care | Payer: Medicaid Other | Source: Intra-hospital | Attending: Psychiatry | Admitting: Psychiatry

## 2018-08-16 ENCOUNTER — Encounter (HOSPITAL_COMMUNITY): Payer: Self-pay

## 2018-08-16 ENCOUNTER — Other Ambulatory Visit: Payer: Self-pay

## 2018-08-16 DIAGNOSIS — Z79899 Other long term (current) drug therapy: Secondary | ICD-10-CM

## 2018-08-16 DIAGNOSIS — D509 Iron deficiency anemia, unspecified: Secondary | ICD-10-CM | POA: Diagnosis present

## 2018-08-16 DIAGNOSIS — Z882 Allergy status to sulfonamides status: Secondary | ICD-10-CM

## 2018-08-16 DIAGNOSIS — F322 Major depressive disorder, single episode, severe without psychotic features: Secondary | ICD-10-CM | POA: Diagnosis present

## 2018-08-16 DIAGNOSIS — Z62810 Personal history of physical and sexual abuse in childhood: Secondary | ICD-10-CM | POA: Diagnosis present

## 2018-08-16 DIAGNOSIS — Z915 Personal history of self-harm: Secondary | ICD-10-CM

## 2018-08-16 DIAGNOSIS — R45851 Suicidal ideations: Secondary | ICD-10-CM | POA: Diagnosis present

## 2018-08-16 DIAGNOSIS — K219 Gastro-esophageal reflux disease without esophagitis: Secondary | ICD-10-CM | POA: Diagnosis present

## 2018-08-16 DIAGNOSIS — J45909 Unspecified asthma, uncomplicated: Secondary | ICD-10-CM | POA: Diagnosis present

## 2018-08-16 DIAGNOSIS — Z791 Long term (current) use of non-steroidal anti-inflammatories (NSAID): Secondary | ICD-10-CM

## 2018-08-16 MED ORDER — ACETAMINOPHEN 325 MG PO TABS
650.0000 mg | ORAL_TABLET | Freq: Four times a day (QID) | ORAL | Status: DC | PRN
  Administered 2018-08-16: 650 mg via ORAL
  Filled 2018-08-16: qty 2

## 2018-08-16 MED ORDER — ENSURE ENLIVE PO LIQD: 237.0000 mL | Freq: Two times a day (BID) | ORAL | Status: DC

## 2018-08-16 MED ORDER — ALUM & MAG HYDROXIDE-SIMETH 200-200-20 MG/5ML PO SUSP: 30.0000 mL | ORAL | Status: DC | PRN

## 2018-08-16 MED ORDER — ADULT MULTIVITAMIN W/MINERALS CH
1.0000 | ORAL_TABLET | Freq: Every day | ORAL | Status: DC
  Administered 2018-08-16: 1 via ORAL
  Filled 2018-08-16 (×5): qty 1

## 2018-08-16 MED ORDER — TRAZODONE HCL 50 MG PO TABS
50.0000 mg | ORAL_TABLET | Freq: Every evening | ORAL | Status: DC | PRN
  Filled 2018-08-16 (×6): qty 1

## 2018-08-16 MED ORDER — MAGNESIUM HYDROXIDE 400 MG/5ML PO SUSP: 30.0000 mL | Freq: Every day | ORAL | Status: DC | PRN

## 2018-08-16 MED ORDER — HYDROXYZINE HCL 25 MG PO TABS: 25.0000 mg | ORAL_TABLET | Freq: Four times a day (QID) | ORAL | Status: DC | PRN

## 2018-08-16 NOTE — Tx Team (Signed)
Interdisciplinary Treatment and Diagnostic Plan Update  08/16/2018 Time of Session:  Angela Huber MRN: [id number]  Principal Diagnosis: <principal problem not specified>  Secondary Diagnoses: Active Problems:   MDD (major depressive disorder), severe (HCC)   Current Medications:  Current Facility-Administered Medications  Medication Dose Route Frequency Provider Last Rate Last Dose  . acetaminophen (TYLENOL) tablet 650 mg  650 mg Oral Q6H PRN Laverle Hobby, PA-C      . alum & mag hydroxide-simeth (MAALOX/MYLANTA) 200-200-20 MG/5ML suspension 30 mL  30 mL Oral Q4H PRN Patriciaann Clan E, PA-C      . feeding supplement (ENSURE ENLIVE) (ENSURE ENLIVE) liquid 237 mL  237 mL Oral BID BM Cobos, Myer Peer, MD      . hydrOXYzine (ATARAX/VISTARIL) tablet 25 mg  25 mg Oral Q6H PRN Patriciaann Clan E, PA-C      . magnesium hydroxide (MILK OF MAGNESIA) suspension 30 mL  30 mL Oral Daily PRN Laverle Hobby, PA-C      . traZODone (DESYREL) tablet 50 mg  50 mg Oral QHS,MR X 1 Simon, Spencer E, PA-C       PTA Medications: Medications Prior to Admission  Medication Sig Dispense Refill Last Dose  . ibuprofen (ADVIL,MOTRIN) 600 MG tablet Take 1 tablet (600 mg total) by mouth every 6 (six) hours. (Patient not taking: Reported on 08/15/2018) 60 tablet 0 Completed Course at Unknown time  . senna-docusate (SENOKOT-S) 8.6-50 MG tablet Take 2 tablets by mouth daily. (Patient not taking: Reported on 08/15/2018) 30 tablet 0 Not Taking at Unknown time    Patient Stressors: Financial difficulties Marital or family conflict Other: 26 week old, 28 year old, 28 year old  Patient Strengths: Ability for insight Active sense of humor Average or above average intelligence Communication skills General fund of knowledge Physical Health Supportive family/friends  Treatment Modalities: Medication Management, Group therapy, Case management,  1 to 1 session with clinician, Psychoeducation, Recreational  therapy.   Physician Treatment Plan for Primary Diagnosis: <principal problem not specified> Long Term Goal(s):     Short Term Goals:    Medication Management: Evaluate patient's response, side effects, and tolerance of medication regimen.  Therapeutic Interventions: 1 to 1 sessions, Unit Group sessions and Medication administration.  Evaluation of Outcomes: Not Met  Physician Treatment Plan for Secondary Diagnosis: Active Problems:   MDD (major depressive disorder), severe ([facility])  Long Term Goal(s):     Short Term Goals:       Medication Management: Evaluate patient's response, side effects, and tolerance of medication regimen.  Therapeutic Interventions: 1 to 1 sessions, Unit Group sessions and Medication administration.  Evaluation of Outcomes: Not Met   RN Treatment Plan for Primary Diagnosis: <principal problem not specified> Long Term Goal(s): Knowledge of disease and therapeutic regimen to maintain health will improve  Short Term Goals: Ability to participate in decision making will improve, Ability to verbalize feelings will improve, Ability to disclose and discuss suicidal ideas and Ability to identify and develop effective coping behaviors will improve  Medication Management: RN will administer medications as ordered by provider, will assess and evaluate patient's response and provide education to patient for prescribed medication. RN will report any adverse and/or side effects to prescribing provider.  Therapeutic Interventions: 1 on 1 counseling sessions, Psychoeducation, Medication administration, Evaluate responses to treatment, Monitor vital signs and CBGs as ordered, Perform/monitor CIWA, COWS, AIMS and Fall Risk screenings as ordered, Perform wound care treatments as ordered.  Evaluation of Outcomes: Not Met   LCSW  Treatment Plan for Primary Diagnosis: <principal problem not specified> Long Term Goal(s): Safe transition to appropriate next level of care at  discharge, Engage patient in therapeutic group addressing interpersonal concerns.  Short Term Goals: Engage patient in aftercare planning with referrals and resources  Therapeutic Interventions: Assess for all discharge needs, 1 to 1 time with Social worker, Explore available resources and support systems, Assess for adequacy in community support network, Educate family and significant other(s) on suicide prevention, Complete Psychosocial Assessment, Interpersonal group therapy.  Evaluation of Outcomes: Not Met   Progress in Treatment: Attending groups: No. Participating in groups: No. Taking medication as prescribed: Yes. Toleration medication: Yes. Family/Significant other contact made: No, will contact:  if patient consents to collateral contacts Patient understands diagnosis: Yes. Discussing patient identified problems/goals with staff: Yes. Medical problems stabilized or resolved: Yes. Denies suicidal/homicidal ideation: Yes. Issues/concerns per patient self-inventory: No. Other:   New problem(s) identified:  New Short Term/Long Term Goal(s): medication stabilization, elimination of SI thoughts, development of comprehensive mental wellness plan.    Patient Goals:  Help with depression    Discharge Plan or Barriers: CSW will assess for appropriate referrals and possible discharge planning.    Reason for Continuation of Hospitalization: Anxiety Depression Medication stabilization Suicidal ideation  Estimated Length of Stay: 08/19/2018  Attendees: Patient: 08/16/2018 9:35 AM  Physician: Dr. Neita Garnet, MD 08/16/2018 9:35 AM  Nursing: Yetta Flock.Lenna Sciara RN 08/16/2018 9:35 AM  RN Care Manager: 08/16/2018 9:35 AM  Social Worker: Theresa Duty 08/16/2018 9:35 AM  Recreational Therapist:  08/16/2018 9:35 AM  Other: Harriett Sine, NP  08/16/2018 9:35 AM  Other:  08/16/2018 9:35 AM  Other: 08/16/2018 9:35 AM    Scribe for Treatment Team: Marylee Floras, [place] 08/16/2018 9:35  AM

## 2018-08-16 NOTE — Tx Team (Signed)
Initial Treatment Plan 08/16/2018 3:44 AM Lottie Dawson MOQ:[id number]    PATIENT STRESSORS: Financial difficulties Marital or family conflict Other: 71 week old, 28 year old, 28 year old   PATIENT STRENGTHS: Ability for insight Active sense of humor Average or above average intelligence Communication skills General fund of knowledge Physical Health Supportive family/friends   PATIENT IDENTIFIED PROBLEMS: "depression"                     DISCHARGE CRITERIA:  Improved stabilization in mood, thinking, and/or behavior Need for constant or close observation no longer present  PRELIMINARY DISCHARGE PLAN: Return to previous living arrangement  PATIENT/FAMILY INVOLVEMENT: This treatment plan has been presented to and reviewed with the patient, Navira Petrash.  The patient and family have been given the opportunity to ask questions and make suggestions.  Edwyna Perfect, RN 08/16/2018, 3:44 AM

## 2018-08-16 NOTE — H&P (Addendum)
Psychiatric Admission Assessment Adult  Patient Identification: Angela Huber MRN:  [id number] Date of Evaluation:  08/16/2018 Chief Complaint:  " I guess I just lost it for a few minutes " Principal Diagnosis: Suicidal Ideations Diagnosis:  Suicidal Ideations , consider Postpartum Depression History of Present Illness: 28 year old married female, two weeks post partum, presented to ED voluntarily, after she made a suicidal statement of wanting to kill herself with a knife .  Today states that this statement was made in anger during an argument with her husband .  Explains  that she had been planning a trip to [place], [place] , where her family resides . States this trip had been planned for a long time and was angered by her husband cancelling trip and " not letting me go alone either".  She denies any actual suicidal intent at this time, and states " I love my kids, I do not want to die, I just said it in anger". She does states she has had some mood swings recently, which she attributes to postpartum ,and to limited local family support as her family is in [place], but overall has been " doing OK". States " really , it was just a bad day, I've been fine"  States she has been mildly depressed over recent days ( not during pregnancy) , but denies having had any suicidal ideations or significant neuro-vegetative symptoms Associated Signs/Symptoms: Depression Symptoms: denies anhedonia or pervasive sense of sadness. States her sleep has been interrupted recently due to breastfeeding, caring for infant daughter, but that in total she does sleep 7 hours per 24 hour period, denies changes in appetite or energy, other than feeling tired at times. Denies any recent suicidal ideations. (Hypo) Manic Symptoms:  Denies  Anxiety Symptoms:  Denies panic attacks or increased  anxiety Psychotic Symptoms:  Denies  PTSD Symptoms: Denies  Total Time spent with patient: 45 minutes  Past Psychiatric History: no prior  psychiatric admissions, no past history of suicide attempts, remote history of self cutting at around age 57. Denies history of psychosis. Denies prior episodes of severe depression , states she had mild postpartum blues during prior pregnancies, but states " it was not bad, I was not really very depressed". Denies history of panic, agoraphobia. Denies history of PTSD, denies history of mania, denies history of violence .   Is the patient at risk to self? Yes.    Has the patient been a risk to self in the past 6 months? No.  Has the patient been a risk to self within the distant past? No.  Is the patient a risk to others? No.  Has the patient been a risk to others in the past 6 months? No.  Has the patient been a risk to others within the distant past? No.   Prior Inpatient Therapy:  denies  Prior Outpatient Therapy:  not currently   Alcohol Screening: 1. How often do you have a drink containing alcohol?: 2 to 3 times a week 2. How many drinks containing alcohol do you have on a typical day when you are drinking?: 3 or 4 3. How often do you have six or more drinks on one occasion?: Never AUDIT-C Score: 4 4. How often during the last year have you found that you were not able to stop drinking once you had started?: Never 5. How often during the last year have you failed to do what was normally expected from you becasue of drinking?: Never 6. How often  during the last year have you needed a first drink in the morning to get yourself going after a heavy drinking session?: Never 7. How often during the last year have you had a feeling of guilt of remorse after drinking?: Never 8. How often during the last year have you been unable to remember what happened the night before because you had been drinking?: Never 9. Have you or someone else been injured as a result of your drinking?: No 10. Has a relative or friend or a doctor or another health worker been concerned about your drinking or suggested  you cut down?: No Alcohol Use Disorder Identification Test Final Score (AUDIT): 4 Alcohol Brief Interventions/Follow-up: AUDIT Score <7 follow-up not indicated Substance Abuse History in the last 12 months:  Denies alcohol or drug abuse  Consequences of Substance Abuse: Denies  Previous Psychotropic Medications: States she was not taking any medications prior to admission and has not been on any psychiatric medication trials in the past either Psychological Evaluations:  No  Past Medical History: reports history of iron deficiency anemia, S/P  Full Term/Vaginal Delivery on 07/31/2018. Currently breastfeeding . Past Medical History:  Diagnosis Date  . Anemia   . Asthma   . GERD (gastroesophageal reflux disease)     Past Surgical History:  Procedure Laterality Date  . KNEE CARTILAGE SURGERY Right    Family History: Parents alive, live together, live in [place]. Has 5 siblings  Family Psychiatric  History: denies history of psychiatric illness in family, no suicides in family  Tobacco Screening: Have you used any form of tobacco in the last 30 days? (Cigarettes, Smokeless Tobacco, Cigars, and/or Pipes): No Social History: 27, married, three children , ages ( 86,64, and two week old). Children currently with her husband. Homemaker . Recently relocated from [place] to [place] in February  Social History   Substance and Sexual Activity  Alcohol Use Not Currently     Social History   Substance and Sexual Activity  Drug Use Never    Additional Social History: Marital status: Married Number of Years Married: 6 What types of issues is patient dealing with in the relationship?: Patient reports her husband works a lot and is not as supportive with their children, as he should be.  Additional relationship information: No  Are you sexually active?: Yes What is your sexual orientation?: Heterosexual  Has your sexual activity been affected by drugs, alcohol, medication, or emotional stress?: No  Does patient  have children?: Yes How many children?: 3 How is patient's relationship with their children?: Patient reports having a close relationship with her three children; ages, 51yo, 80yo, 67 week old daughters  Allergies:   Allergies  Allergen Reactions  . Sulfa Antibiotics Hives   Lab Results:  Results for orders placed or performed during the hospital encounter of 08/15/18 (from the past 48 hour(s))  Rapid urine drug screen (hospital performed)     Status: None   Collection Time: 08/15/18  8:58 PM  Result Value Ref Range   Opiates NONE DETECTED NONE DETECTED   Cocaine NONE DETECTED NONE DETECTED   Benzodiazepines NONE DETECTED NONE DETECTED   Amphetamines NONE DETECTED NONE DETECTED   Tetrahydrocannabinol NONE DETECTED NONE DETECTED   Barbiturates NONE DETECTED NONE DETECTED    Comment: (NOTE) DRUG SCREEN FOR MEDICAL PURPOSES ONLY.  IF CONFIRMATION IS NEEDED FOR ANY PURPOSE, NOTIFY LAB WITHIN 5 DAYS. LOWEST DETECTABLE LIMITS FOR URINE DRUG SCREEN Drug Class  Cutoff (ng/mL) Amphetamine and metabolites    1000 Barbiturate and metabolites    200 Benzodiazepine                 200 Tricyclics and metabolites     300 Opiates and metabolites        300 Cocaine and metabolites        300 THC                            50 Performed at [facility], [address], [place], [place] [address]   Comprehensive metabolic panel     Status: Abnormal   Collection Time: 08/15/18  9:11 PM  Result Value Ref Range   Sodium 141 135 - 145 mmol/L   Potassium 3.6 3.5 - 5.1 mmol/L   Chloride 111 98 - 111 mmol/L   CO2 22 22 - 32 mmol/L   Glucose, Bld 90 70 - 99 mg/dL   BUN 14 6 - 20 mg/dL   Creatinine, Ser 4.85 0.44 - 1.00 mg/dL   Calcium 8.8 (L) 8.9 - 10.3 mg/dL   Total Protein 7.9 6.5 - 8.1 g/dL   Albumin 3.7 3.5 - 5.0 g/dL   AST 54 (H) 15 - 41 U/L   ALT 71 (H) 0 - 44 U/L   Alkaline Phosphatase 118 38 - 126 U/L   Total Bilirubin 1.0 0.3 - 1.2 mg/dL   GFR calc non Af  Amer >60 >60 mL/min   GFR calc Af Amer >60 >60 mL/min   Anion gap 8 5 - 15    Comment: Performed at [facility], [address], [place], [place] [address]  Ethanol     Status: None   Collection Time: 08/15/18  9:11 PM  Result Value Ref Range   Alcohol, Ethyl (B) <10 <10 mg/dL    Comment: (NOTE) Lowest detectable limit for serum alcohol is 10 mg/dL. For medical purposes only. Performed at [facility], [address] [place], [place] [address]   Salicylate level     Status: None   Collection Time: 08/15/18  9:11 PM  Result Value Ref Range   Salicylate Lvl <7.0 2.8 - 30.0 mg/dL    Comment: Performed at [facility], [address] [place], [place] [address]  Acetaminophen level     Status: Abnormal   Collection Time: 08/15/18  9:11 PM  Result Value Ref Range   Acetaminophen (Tylenol), Serum <10 (L) 10 - 30 ug/mL    Comment: (NOTE) Therapeutic concentrations vary significantly. A range of 10-30 ug/mL  may be an effective concentration for many patients. However, some  are best treated at concentrations outside of this range. Acetaminophen concentrations >150 ug/mL at 4 hours after ingestion  and >50 ug/mL at 12 hours after ingestion are often associated with  toxic reactions. Performed at [facility], [address], [place], [place] [address]   cbc     Status: Abnormal   Collection Time: 08/15/18  9:11 PM  Result Value Ref Range   WBC 8.4 4.0 - 10.5 K/uL   RBC 4.33 3.87 - 5.11 MIL/uL   Hemoglobin 10.4 (L) 12.0 - 15.0 g/dL   HCT 69.6 (L) 78.9 - 38.1 %   MCV 80.4 80.0 - 100.0 fL   MCH 24.0 (L) 26.0 - 34.0 pg   MCHC 29.9 (L) 30.0 - 36.0 g/dL   RDW 01.7 (H) 51.0 - 25.8 %  Platelets 339 150 - 400 K/uL   nRBC 0.0 0.0 - 0.2 %    Comment: Performed at [facility], [address], [place], [place] [address]  I-Stat beta hCG blood, ED     Status: None   Collection Time: 08/15/18  9:22 PM  Result Value Ref Range   I-stat hCG, quantitative <5.0  <5 mIU/mL   Comment 3            Comment:   GEST. AGE      CONC.  (mIU/mL)   <=1 WEEK        5 - 50     2 WEEKS       50 - 500     3 WEEKS       100 - 10,000     4 WEEKS     1,000 - 30,000        FEMALE AND NON-PREGNANT FEMALE:     LESS THAN 5 mIU/mL     Blood Alcohol level:  Lab Results  Component Value Date   ETH <10 08/15/2018    Metabolic Disorder Labs:  Lab Results  Component Value Date   HGBA1C 5.3 07/31/2018   MPG 105.41 07/31/2018   No results found for: PROLACTIN No results found for: CHOL, TRIG, HDL, CHOLHDL, VLDL, LDLCALC  Current Medications: Current Facility-Administered Medications  Medication Dose Route Frequency Provider Last Rate Last Dose  . acetaminophen (TYLENOL) tablet 650 mg  650 mg Oral Q6H PRN Kerry Hough, PA-C      . alum & mag hydroxide-simeth (MAALOX/MYLANTA) 200-200-20 MG/5ML suspension 30 mL  30 mL Oral Q4H PRN Donell Sievert E, PA-C      . feeding supplement (ENSURE ENLIVE) (ENSURE ENLIVE) liquid 237 mL  237 mL Oral BID BM Cobos, Rockey Situ, MD      . hydrOXYzine (ATARAX/VISTARIL) tablet 25 mg  25 mg Oral Q6H PRN Donell Sievert E, PA-C      . magnesium hydroxide (MILK OF MAGNESIA) suspension 30 mL  30 mL Oral Daily PRN Kerry Hough, PA-C      . multivitamin with minerals tablet 1 tablet  1 tablet Oral Daily Cobos, Rockey Situ, MD      . traZODone (DESYREL) tablet 50 mg  50 mg Oral QHS,MR X 1 Simon, Spencer E, PA-C       PTA Medications: Medications Prior to Admission  Medication Sig Dispense Refill Last Dose  . ibuprofen (ADVIL,MOTRIN) 600 MG tablet Take 1 tablet (600 mg total) by mouth every 6 (six) hours. (Patient not taking: Reported on 08/15/2018) 60 tablet 0 Completed Course at Unknown time  . senna-docusate (SENOKOT-S) 8.6-50 MG tablet Take 2 tablets by mouth daily. (Patient not taking: Reported on 08/15/2018) 30 tablet 0 Not Taking at Unknown time    Musculoskeletal: Strength & Muscle Tone: within normal limits Gait & Station:  normal Patient leans: N/A  Psychiatric Specialty Exam: Physical Exam  Review of Systems  Constitutional: Negative.  Negative for chills and fever.  HENT: Negative.   Eyes: Negative.   Respiratory: Negative.   Cardiovascular: Negative.   Gastrointestinal: Negative for blood in stool, nausea and vomiting.  Genitourinary: Negative.   Musculoskeletal: Negative.   Skin: Negative.   Neurological: Negative for seizures and headaches.  Endo/Heme/Allergies: Negative.   Psychiatric/Behavioral: Positive for suicidal ideas.  All other systems reviewed and are negative.   Blood pressure 117/80, pulse 88, temperature 98.4 F (36.9 C), temperature source Oral, resp. rate 14, height 5'  3" (1.6 m), weight 87.1 kg, unknown if currently breastfeeding.Body mass index is 34.01 kg/m.  General Appearance: Well Groomed  Eye Contact:  Good  Speech:  Normal Rate  Volume:  Normal  Mood:  states that she feels "OK" today, does not currently present depressed   Affect:  appropriate, reactive  Thought Process:  Linear and Descriptions of Associations: Intact  Orientation:  Full (Time, Place, and Person)  Thought Content:  denies any hallucinations, no delusions   Suicidal Thoughts:  No denies suicidal or self injurious ideations at this time, denies any homicidal or violent ideations. Specifically denies any violent or homicidal ideations towards her child or towards her husband   Homicidal Thoughts:  No  Memory:  recent and remote grossly intact   Judgement:  Other:  improving   Insight:  fair   Psychomotor Activity:  Normal  Concentration:  Concentration: Good and Attention Span: Good  Recall:  Good  Fund of Knowledge:  Good  Language:  Good  Akathisia:  Negative  Handed:  Right  AIMS (if indicated):     Assets:  Communication Skills Desire for Improvement Resilience  ADL's:  Intact  Cognition:  WNL  Sleep:  Number of Hours: 2(new pt)    Treatment Plan Summary: Daily contact with patient to  assess and evaluate symptoms and progress in treatment, Medication management, Plan inpatient admission and medications as below  Observation Level/Precautions:  15 minute checks  Laboratory:  as needed -Patient's AST/ALT are slightly elevated.  No associated symptoms.  No known history of liver disease.?  Fatty liver.  She thinks she was tested for hepatitis C at some point in the past but is unsure.  She is agreeing to hep C/hep B testing.   Psychotherapy: milieu, group therapy     Medications:  We discussed options with patient- as above, states she feels her depression has been mild , and does not feel she needs to be on standing psychiatric medication at this time. Trazodone/ Vistaril PRNs for insomnia or anxiety as needed   Consultations:  As needed   Discharge Concerns:  -  Estimated LOS: 3-4 days   Other:     Physician Treatment Plan for Primary Diagnosis:  Suicidal Ideations  Long Term Goal(s): Improvement in symptoms so as ready for discharge  Short Term Goals: Ability to identify changes in lifestyle to reduce recurrence of condition will improve, Ability to verbalize feelings will improve, Ability to disclose and discuss suicidal ideas, Ability to demonstrate self-control will improve, Ability to identify and develop effective coping behaviors will improve and Ability to maintain clinical measurements within normal limits will improve  Physician Treatment Plan for Secondary Diagnosis: Suicidal Ideations  Long Term Goal(s): Improvement in symptoms so as ready for discharge  Short Term Goals: Ability to identify changes in lifestyle to reduce recurrence of condition will improve, Ability to verbalize feelings will improve, Ability to disclose and discuss suicidal ideas, Ability to demonstrate self-control will improve, Ability to identify and develop effective coping behaviors will improve and Ability to maintain clinical measurements within normal limits will improve  I certify that  inpatient services furnished can reasonably be expected to improve the patient's condition.    Craige Cotta, MD 3/20/202010:41 AM

## 2018-08-16 NOTE — ED Notes (Signed)
Patient has signed Vol.Consent form and has been faxed to [facility]

## 2018-08-16 NOTE — Progress Notes (Signed)
Recreation Therapy Notes  Date:  3.20.20 Time: 0930 Location: [address] Dayroom  Group Topic: Stress Management  Goal Area(s) Addresses:  Patient will identify positive stress management techniques. Patient will identify benefits of using stress management post d/c.  Intervention:  Stress Management   Activity :  Meditation.  LRT introduced patients to the stress management technique of meditation.  LRT played a meditation that focused on making the most of your day and the possibilities that can be had.  Patients were to follow along as meditation played to engage in activity.   Education:  Stress Management, Discharge Planning.   Education Outcome: Acknowledges Education  Clinical Observations/Feedback: Pt did not attend group.      Kenzlei Runions, LRT/CTRS         Adryanna Friedt A 08/16/2018 11:06 AM 
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
## 2018-08-16 NOTE — BHH Counselor (Signed)
Per Caryl Ada, RN pt has ben accepted to [facility] and assigned to room/bed: 403-1. Pt can come now. Accepting physician: Donell Sievert, PA. Attending physician: Dr. Jama Flavors. Nursing report: [phone]. Voluntary paperwork to be faxed. Updated disposition dicussed with Gabriel Rung, RN.    Redmond Pulling, MS, [facility], [facility] Triage Specialist [phone]

## 2018-08-16 NOTE — ED Notes (Signed)
Pt has called spouse to inform of impending transfer to [facility] tonight-Monique,RN

## 2018-08-16 NOTE — Progress Notes (Signed)
Per report from [facility] patient having SI with plan to grab a kitchen knife and patient was brought into the ED by her husband. Per report patient has a history of postpartum depression. Patient is 2 weeks postpartum with a baby girl and also has two other daughters (85 and 26 years old). Maris denies SI, HI, AVH, and contracts for safety. At first she denies having thoughts of hurting herself at all but then comments that she told the ED nurse how she was having thoughts of killing herself with a kitchen knife and these thoughts have been since the birth of her daughter two weeks ago. Patient endorses feeling depressed and crying spells. She reports that her stressors are having three young children and financial concerns. Her supports system is her husband. The family moved from [place], [place] on February 22nd to live with the husbands mother. Patient reports not liking her husbands mother. Patient reports social alcohol use, denies tobacco, denies drug use, and denies history of/current abuse. Pippa reports a history of self harm in the form of cutting when she was 28 years old. Patient is a low fall risk, is not on any prescription medications, and is currently lactating/breast feeding her two week old. Patient would like a breast pump if possible. Maryum reports she is allergic to sulfa antibiotics, was told by her doctor that she is anemic, and has lost over 20 pounds in the past two weeks. She denies poor appetite but reports only eating once a day. Rights and responsibilities described to patient and she is oriented to the unit. Patient offered food and drink. Patient only wanted water to drink before going to her room.

## 2018-08-16 NOTE — Progress Notes (Addendum)
NUTRITION ASSESSMENT  Pt identified as at risk on the Malnutrition Screen Tool  INTERVENTION: - Continue Ensure Enlive BID, each supplement provides 350 kcal and 20 grams of protein. - Will order daily multivitamin with minerals. - Continue to encourage PO intakes.   NUTRITION DIAGNOSIS: Increased nutrient needs related to post-partum as evidenced by estimated nutrition needs.  Goal: Pt to meet >/= 90% of their estimated nutrition needs.  Monitor:  PO intake  Assessment:   Patient admitted for severe depression and SI. Notes indicate that patient gave birth 2 weeks PTA. Notes also state that patient has given birth twice in the past and that current symptoms also occurred after both of those births.  Per chart review, current weight is 192 lb and weight on 3/4 (around the time that patient gave birth) was 219 lb.     28 y.o. female  Height: Ht Readings from Last 1 Encounters:  08/16/18 5\' 3"  (1.6 m)    Weight: Wt Readings from Last 1 Encounters:  08/16/18 87.1 kg    Weight Hx: Wt Readings from Last 10 Encounters:  08/16/18 87.1 kg  08/15/18 89.4 kg  07/31/18 99.5 kg    BMI:  Body mass index is 34.01 kg/m. Pt meets criteria for obesity based on current BMI.  Estimated Nutritional Needs: Kcal: 30-35 kcal/kg Protein: > 1.5 gram protein/kg Fluid: 1 ml/kcal  Diet Order:  Diet Order            Diet regular Room service appropriate? Yes; Fluid consistency: Thin  Diet effective now             Pt is also offered choice of unit snacks mid-morning and mid-afternoon.  Pt is eating as desired.   Lab results and medications reviewed.      Trenton Gammon, MS, RD, LDN, [facility] Inpatient Clinical Dietitian Pager # [phone] After hours/weekend pager # [phone]

## 2018-08-16 NOTE — BHH Suicide Risk Assessment (Signed)
[facility] Admission Suicide Risk Assessment   Nursing information obtained from:  Patient Demographic factors:  Adolescent or young adult, Low socioeconomic status, Unemployed Current Mental Status:  NA Loss Factors:  Financial problems / change in socioeconomic status Historical Factors:  NA Risk Reduction Factors:  Responsible for children under 26 years of age, Sense of responsibility to family, Living with another person, especially a relative  Total Time spent with patient: 45 minutes Principal Problem: Suicidal ideations Diagnosis:  Active Problems:   MDD (major depressive disorder), severe (HCC)  Subjective Data:   Continued Clinical Symptoms:  Alcohol Use Disorder Identification Test Final Score (AUDIT): 4 The "Alcohol Use Disorders Identification Test", Guidelines for Use in Primary Care, Second Edition.  World Science writer [facility]). Score between 0-7:  no or low risk or alcohol related problems. Score between 8-15:  moderate risk of alcohol related problems. Score between 16-19:  high risk of alcohol related problems. Score 20 or above:  warrants further diagnostic evaluation for alcohol dependence and treatment.   CLINICAL FACTORS:  28 year old female, 2 weeks postpartum, presented to ED after making a suicidal statement she wanted to kill herself.  Patient states that this statement was made during an argument with her husband, and states she said it in frustration/anger, without any real plan or intention.  She reports some mild depression without significant neurovegetative symptoms or any suicidal ideations recently.  Of note, also denies any violent or homicidal ideations towards her husband or children.  At this time is future oriented, looking forward to see her children and to a trip to [place] to see her family.    Psychiatric Specialty Exam: Physical Exam  ROS  Blood pressure 117/80, pulse 88, temperature 98.4 F (36.9 C), temperature source Oral, resp. rate  14, height 5\' 3"  (1.6 m), weight 87.1 kg, unknown if currently breastfeeding.Body mass index is 34.01 kg/m.  See admit note MSE    COGNITIVE FEATURES THAT CONTRIBUTE TO RISK:  Closed-mindedness and Loss of executive function    SUICIDE RISK:   Moderate:  Frequent suicidal ideation with limited intensity, and duration, some specificity in terms of plans, no associated intent, good self-control, limited dysphoria/symptomatology, some risk factors present, and identifiable protective factors, including available and accessible social support.  PLAN OF CARE: Patient will be admitted to inpatient psychiatric unit for stabilization and safety. Will provide and encourage milieu participation. Provide medication management and maked adjustments as needed.  Will follow daily.    I certify that inpatient services furnished can reasonably be expected to improve the patient's condition.   Craige Cotta, MD 08/16/2018, 11:18 AM

## 2018-08-16 NOTE — BHH Suicide Risk Assessment (Signed)
BHH INPATIENT:  Family/Significant Other Suicide Prevention Education  Suicide Prevention Education:  Education Completed; with husband, Angela Huber [phone])  has been identified by the patient as the family member/significant other with whom the patient will be residing, and identified as the person(s) who will aid the patient in the event of a mental health crisis (suicidal ideations/suicide attempt).  With written consent from the patient, the family member/significant other has been provided the following suicide prevention education, prior to the and/or following the discharge of the patient.  The suicide prevention education provided includes the following:  Suicide risk factors  Suicide prevention and interventions  National Suicide Hotline telephone number  [facility] assessment telephone number  [facility] Emergency Assistance 911  [facility] and/or Residential Mobile Crisis Unit telephone number  Request made of family/significant other to:  Remove weapons (e.g., guns, rifles, knives), all items previously/currently identified as safety concern.    Remove drugs/medications (over-the-counter, prescriptions, illicit drugs), all items previously/currently identified as a safety concern.  The family member/significant other verbalizes understanding of the suicide prevention education information provided.  The family member/significant other agrees to remove the items of safety concern listed above.  Patient's husband reports that he brought his wife to the hospital "just to be sure". He reports that his wife has struggled with depression since having their youngest daughter two weeks ago. He also shared that he and the patient recently moved from [place] one month ago and that the patient misses her family. The patient's husband states that he does not have any concerns with the patient's safety at discharge and that he does not believe she would harm  herself or anyone else. CSW will continue to follow.   Angela Huber 08/16/2018, 11:20 AM

## 2018-08-16 NOTE — Plan of Care (Addendum)
Patient self inventory- Patient slept fair last night, sleep medication was not requested. Appetite is good, energy level normal, and concentration good. Depression, hopelessness, and anxiety all rated 0/10. Denies SI HI AVH. Denies physical pain. Patient wrote, "My baby is 2 weeks and I need to go home and be with her because her dad can't handle three kids. Can I go home and be with my baby. I never want to hurt myself. I want to be there for my girls." Patient safety is maintained with 15 minute checks as well as environmental checks. Will continue to monitor and provide support.   Problem: Education: Goal: Emotional status will improve Outcome: Progressing Goal: Mental status will improve Outcome: Progressing Goal: Verbalization of understanding the information provided will improve Outcome: Progressing

## 2018-08-16 NOTE — ED Provider Notes (Signed)
[facility] EMERGENCY DEPARTMENT Provider Note   CSN: [id number] Arrival date & time: 08/15/18  2012    History   Chief Complaint Chief Complaint  Patient presents with  . Suicidal    HPI Angela Huber is a 28 y.o. female.     HPI   28 year old female with history of vaginal delivery 07/31/2018, presents with concern for depression and suicidal ideation.  Reports that she had postpartum depression after the birth of her other 2 kids.  She says she began to feel depressed approximately 4 days prior to the child, Tonna Corner, being born and depression really worsened 1 week postpartum.  Reports she has had episodes of uncontrollable tearfulness, suicidal ideation. Reports she will stand in the shower and think about all the ways she could kill herself without her family finding out and also reported thoughts of grabbing a knife to stab herself.  Husband brought her in due to concerns regarding her tearful episodes and mood changes.   Past Medical History:  Diagnosis Date  . Anemia   . Asthma   . GERD (gastroesophageal reflux disease)     Patient Active Problem List   Diagnosis Date Noted  . Decreased fetal movement 07/31/2018  . Obesity 07/31/2018  . Insufficient prenatal care in third trimester 03/28/2018  . History of preterm delivery, currently pregnant 03/27/2018    Past Surgical History:  Procedure Laterality Date  . KNEE CARTILAGE SURGERY Right      OB History    Gravida  4   Para  3   Term  2   Preterm  1   AB  1   Living  3     SAB  1   TAB      Ectopic      Multiple  0   Live Births  3            Home Medications    Prior to Admission medications   Medication Sig Start Date End Date Taking? Authorizing Provider  ibuprofen (ADVIL,MOTRIN) 600 MG tablet Take 1 tablet (600 mg total) by mouth every 6 (six) hours. Patient not taking: Reported on 08/15/2018 08/02/18   Orpah Cobb P, DO  senna-docusate (SENOKOT-S) 8.6-50 MG  tablet Take 2 tablets by mouth daily. Patient not taking: Reported on 08/15/2018 08/03/18   Joana Reamer, DO    Family History No family history on file.  Social History Social History   Tobacco Use  . Smoking status: Never Smoker  . Smokeless tobacco: Never Used  Substance Use Topics  . Alcohol use: Not Currently  . Drug use: Never     Allergies   Sulfa antibiotics   Review of Systems Review of Systems  Constitutional: Negative for fever.  HENT: Negative for sore throat.   Eyes: Negative for visual disturbance.  Respiratory: Negative for cough and shortness of breath.   Cardiovascular: Negative for chest pain.  Gastrointestinal: Negative for abdominal pain.  Genitourinary: Negative for difficulty urinating.  Musculoskeletal: Negative for back pain and neck pain.  Skin: Negative for rash.  Neurological: Negative for syncope and headaches.     Physical Exam Updated Vital Signs BP 117/81 (BP Location: Right Arm)   Pulse 85   Temp 98.7 F (37.1 C) (Oral)   Resp 16   Ht 5\' 3"  (1.6 m)   Wt 89.4 kg   SpO2 100%   BMI 34.90 kg/m   Physical Exam Constitutional:      Appearance: Normal appearance.  HENT:     Head: Normocephalic and atraumatic.     Mouth/Throat:     Mouth: Mucous membranes are moist.  Neck:     Musculoskeletal: No neck rigidity.  Cardiovascular:     Rate and Rhythm: Normal rate and regular rhythm.  Pulmonary:     Effort: Pulmonary effort is normal. No respiratory distress.  Neurological:     Mental Status: She is alert.      ED Treatments / Results  Labs (all labs ordered are listed, but only abnormal results are displayed) Labs Reviewed  COMPREHENSIVE METABOLIC PANEL - Abnormal; Notable for the following components:      Result Value   Calcium 8.8 (*)    AST 54 (*)    ALT 71 (*)    All other components within normal limits  ACETAMINOPHEN LEVEL - Abnormal; Notable for the following components:   Acetaminophen (Tylenol), Serum  <10 (*)    All other components within normal limits  CBC - Abnormal; Notable for the following components:   Hemoglobin 10.4 (*)    HCT 34.8 (*)    MCH 24.0 (*)    MCHC 29.9 (*)    RDW 16.8 (*)    All other components within normal limits  ETHANOL  SALICYLATE LEVEL  RAPID URINE DRUG SCREEN, HOSP PERFORMED  I-STAT BETA HCG BLOOD, ED ([facility], [facility], [facility] ONLY)    EKG None  Radiology No results found.  Procedures Procedures (including critical care time)  Medications Ordered in ED Medications - No data to display   Initial Impression / Assessment and Plan / ED Course  I have reviewed the triage vital signs and the nursing notes.  Pertinent labs & imaging results that were available during my care of the patient were reviewed by me and considered in my medical decision making (see chart for details).        28 year old female with history of vaginal delivery 07/31/2018, presents with concern for depression and suicidal ideation.  Patient medically cleared, no medical concerns..   Severe SI and depression in postpartum state. Voluntary. Consulted TTS, she meets inpt criteria, awaiting placement.   Final Clinical Impressions(s) / ED Diagnoses   Final diagnoses:  Postpartum depression  Suicidal ideation    ED Discharge Orders    None       Alvira Monday, MD 08/16/18 [phone]

## 2018-08-17 DIAGNOSIS — R45851 Suicidal ideations: Secondary | ICD-10-CM

## 2018-08-17 MED ORDER — TRAZODONE HCL 50 MG PO TABS: 50.0000 mg | ORAL_TABLET | Freq: Every evening | ORAL | 0 refills | Status: AC | PRN

## 2018-08-17 MED ORDER — SERTRALINE HCL 25 MG PO TABS: 25.0000 mg | ORAL_TABLET | Freq: Every day | ORAL | 0 refills | Status: AC

## 2018-08-17 MED ORDER — SERTRALINE HCL 25 MG PO TABS
25.0000 mg | ORAL_TABLET | Freq: Every day | ORAL | Status: DC
  Administered 2018-08-17: 25 mg via ORAL
  Filled 2018-08-17 (×3): qty 1

## 2018-08-17 NOTE — Progress Notes (Signed)
Adult Psychoeducational Group Note  Date:  08/17/2018 Time:  4:39 AM  Group Topic/Focus:  Wrap-Up Group:   The focus of this group is to help patients review their daily goal of treatment and discuss progress on daily workbooks.  Participation Level:  Active  Participation Quality:  Appropriate and Attentive  Affect:  Appropriate  Cognitive:  Alert and Appropriate  Insight: Appropriate and Good  Engagement in Group:  Engaged  Modes of Intervention:  Discussion  Additional Comments:  Pt attend wrap up group. Her day was a 10. The one positive thing that happened to her she got color pencils.  Charna Busman Long 08/17/2018, 4:39 AM

## 2018-08-17 NOTE — Progress Notes (Signed)
Patient has been up and active on the unit, attended group this evening and is looking forward to discharge so that she can see her newborn. She misses her family.   Support and encouragement offered, safety maintained on unit, will continue to monitor.

## 2018-08-17 NOTE — Progress Notes (Signed)
  [facility] Adult Case Management Discharge Plan :  Will you be returning to the same living situation after discharge:  Yes,  home At discharge, do you have transportation home?: Yes,  husband picking up between 10:30am-11:00am Do you have the ability to pay for your medications: Yes,  Medicaid  Release of information consent forms completed and in the chart;  Letter on chart. Patient to Follow up at: Follow-up Information    [facility] Follow up on 08/21/2018.   Why:  Hospital follow up appointment is Wednesday, 3/25 at 9:30a.  At this time your appointment will be conducted over the telephone.  Contact information: [address] [place] [address] [phone]           Next level of care provider has access to [facility] Link:no  Safety Planning and Suicide Prevention discussed: Yes,  with husband  Have you used any form of tobacco in the last 30 days? (Cigarettes, Smokeless Tobacco, Cigars, and/or Pipes): No  Has patient been referred to the Quitline?: N/A patient is not a smoker  Patient has been referred for addiction treatment: Yes  Darreld Mclean, LCSWA 08/17/2018, 9:48 AM
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
## 2018-08-17 NOTE — Progress Notes (Signed)
Pt completed her daily assessment and on this she wrote she denied SI today. She rated her depression, hopelessness and anxiety  " 0/0/0/", respectively. Her dc teaching was done and pt verbalized understanding . Pt was given cc of dc instrucitons ( SRA, AVS, SSP and transition record) and all belongings were returned to her and she was escorted to the lobby.

## 2018-08-17 NOTE — [facility] Suicide Risk Assessment (Addendum)
[facility] Discharge Suicide Risk Assessment   Principal Problem: depression Discharge Diagnoses: Active Problems:   MDD (major depressive disorder), severe (HCC)   Total Time spent with patient: 45 minutes  Musculoskeletal: Strength & Muscle Tone: within normal limits Gait & Station: normal Patient leans: N/A  Psychiatric Specialty Exam: ROS no headache, no chest pain , no shortness of breath, no vomiting, no fever , no chills   Blood pressure 116/71, pulse 91, temperature 98.4 F (36.9 C), temperature source Oral, resp. rate 14, height 5\' 3"  (1.6 m), weight 87.1 kg, unknown if currently breastfeeding.Body mass index is 34.01 kg/m.  General Appearance: Well Groomed  Eye Contact::  Good  Speech:  Normal Rate409  Volume:  Normal  Mood:  reports feeling better today, and presents with improved mood and full range of affect  Affect:  Full Range  Thought Process:  Linear and Descriptions of Associations: Intact  Orientation:  Full (Time, Place, and Person)  Thought Content:  no halluicinations, no delusions  Suicidal Thoughts:  No denies suicidal ideations, denies self injurious ideations, denies homicidal or violent ideations, specifically also denies any homicidal or violent ideations towards husband or child  Homicidal Thoughts:  No  Memory:  recent and remote grossly intact   Judgement:  Other:  imporving   Insight:  improving   Psychomotor Activity:  Normal  Concentration:  Good  Recall:  Good  Fund of Knowledge:Good  Language: Good  Akathisia:  Negative  Handed:  Right  AIMS (if indicated):     Assets:  Communication Skills Desire for Improvement Resilience  Sleep:  Number of Hours: 5.75  Cognition: WNL  ADL's:  Intact   Mental Status Per Nursing Assessment::   On Admission:  NA  Demographic Factors:  27, married,two weeks post partum, lives with husband and infant daughter   Loss Factors: Marital argument/ patient's family lives out of state and she has limited  support network locally, recent delivery/newborn  Historical Factors: No prior psychiatric admissions, no history of suicide attempts  Risk Reduction Factors:   Responsible for children under 31 years of age, Sense of responsibility to family, Living with another person, especially a relative and Positive social support  Continued Clinical Symptoms:  At this time patient is alert, attentive, describes improved mood, affect is more reactive and today full in range.  No thought disorder.  Denies any suicidal ideations, no homicidal ideations, specifically denies any violent or homicidal ideations towards her husband or child.  No psychotic symptoms, future oriented. Behavior in good control , visible in day room, interacting appropriately with peers , pleasant on approach Today patient reports she is interested in starting an antidepressant medication, and states she feels she has been experiencing a low-grade but persistent sadness/depression, although acknowledges she is feeling noticeably better today.  We discussed options.  She is agreeing to Zoloft, will start at 25 mg daily.  Side effects reviewed.  Of note, she states she thinks she is not breast-feeding anymore.  We have reviewed consideration regarding SSRI/Zoloft/lactation With her express consent and in her presence I spoke with Mr Leticia Clas, her husband. He corroborates that patient seems much improved and supports discharge today.   Cognitive Features That Contribute To Risk:  No gross cognitive deficits noted upon discharge. Is alert , attentive, and oriented x 3   Suicide Risk:  Mild:  Suicidal ideation of limited frequency, intensity, duration, and specificity.  There are no identifiable plans, no associated intent, mild dysphoria and related symptoms, good self-control (  both objective and subjective assessment), few other risk factors, and identifiable protective factors, including available and accessible social support.  Follow-up  Information    [facility] Follow up on 08/21/2018.   Why:  Hospital follow up appointment is Wednesday, 3/25 at 9:30a.  At this time your appointment will be conducted over the telephone.  Contact information: [address] [place] [address] [phone]           Plan Of Care/Follow-up recommendations:  Activity:  as tolerated  Diet:  regular Tests:  NA Other:  See below  Patient is expressing readiness for discharge and there are no current grounds for involuntary commitment . She is leaving unit in good spirits.  Plans to return home Follow up as above  Plans to follow-up with her outpatient OB for postnatal care  Craige Cotta, MD 08/17/2018, 9:10 AM

## 2018-08-18 LAB — HEPATITIS C ANTIBODY: HCV Ab: 0.1 s/co ratio (ref 0.0–0.9)

## 2018-08-18 LAB — HEPATITIS B SURFACE ANTIGEN: Hepatitis B Surface Ag: NEGATIVE

## 2018-08-18 NOTE — Progress Notes (Signed)
Patient ID: Angela Huber, female   DOB: 14-Feb-1991, 28 y.o.   MRN: [id number] 08/18/2018, 5 PM Phone communication documentation.  I called/spoke with patient personally on the phone to inform her regarding negative hep B/hep C results.  Sallyanne Havers MD

## 2018-08-29 ENCOUNTER — Ambulatory Visit: Payer: Medicaid Other | Admitting: Obstetrics and Gynecology

## 2019-04-03 ENCOUNTER — Encounter (HOSPITAL_COMMUNITY): Payer: Self-pay

## 2019-04-03 ENCOUNTER — Inpatient Hospital Stay (HOSPITAL_BASED_OUTPATIENT_CLINIC_OR_DEPARTMENT_OTHER): Payer: Medicaid Other

## 2019-04-03 ENCOUNTER — Other Ambulatory Visit: Payer: Self-pay

## 2019-04-03 ENCOUNTER — Inpatient Hospital Stay (HOSPITAL_COMMUNITY)
Admission: AD | Admit: 2019-04-03 | Discharge: 2019-04-03 | Disposition: A | Discharge status: Home / Self Care | Payer: Medicaid Other | Attending: Obstetrics & Gynecology | Admitting: Obstetrics & Gynecology

## 2019-04-03 DIAGNOSIS — O09212 Supervision of pregnancy with history of pre-term labor, second trimester: Secondary | ICD-10-CM | POA: Insufficient documentation

## 2019-04-03 DIAGNOSIS — Z0371 Encounter for suspected problem with amniotic cavity and membrane ruled out: Secondary | ICD-10-CM | POA: Diagnosis present

## 2019-04-03 DIAGNOSIS — O09892 Supervision of other high risk pregnancies, second trimester: Secondary | ICD-10-CM

## 2019-04-03 DIAGNOSIS — Z79899 Other long term (current) drug therapy: Secondary | ICD-10-CM | POA: Insufficient documentation

## 2019-04-03 DIAGNOSIS — O99512 Diseases of the respiratory system complicating pregnancy, second trimester: Secondary | ICD-10-CM | POA: Insufficient documentation

## 2019-04-03 DIAGNOSIS — O0932 Supervision of pregnancy with insufficient antenatal care, second trimester: Secondary | ICD-10-CM

## 2019-04-03 DIAGNOSIS — J45909 Unspecified asthma, uncomplicated: Secondary | ICD-10-CM | POA: Insufficient documentation

## 2019-04-03 DIAGNOSIS — O99891 Other specified diseases and conditions complicating pregnancy: Secondary | ICD-10-CM | POA: Insufficient documentation

## 2019-04-03 DIAGNOSIS — N898 Other specified noninflammatory disorders of vagina: Secondary | ICD-10-CM | POA: Diagnosis not present

## 2019-04-03 DIAGNOSIS — Z882 Allergy status to sulfonamides status: Secondary | ICD-10-CM | POA: Diagnosis not present

## 2019-04-03 DIAGNOSIS — Z3A20 20 weeks gestation of pregnancy: Secondary | ICD-10-CM | POA: Insufficient documentation

## 2019-04-03 DIAGNOSIS — O26892 Other specified pregnancy related conditions, second trimester: Secondary | ICD-10-CM | POA: Diagnosis not present

## 2019-04-03 DIAGNOSIS — O368120 Decreased fetal movements, second trimester, not applicable or unspecified: Secondary | ICD-10-CM | POA: Diagnosis not present

## 2019-04-03 DIAGNOSIS — R102 Pelvic and perineal pain: Secondary | ICD-10-CM | POA: Diagnosis not present

## 2019-04-03 DIAGNOSIS — O09899 Supervision of other high risk pregnancies, unspecified trimester: Secondary | ICD-10-CM

## 2019-04-03 LAB — WET PREP, GENITAL
Clue Cells Wet Prep HPF POC: NONE SEEN
Sperm: NONE SEEN
Trich, Wet Prep: NONE SEEN
Yeast Wet Prep HPF POC: NONE SEEN

## 2019-04-03 LAB — URINALYSIS, ROUTINE W REFLEX MICROSCOPIC
Bilirubin Urine: NEGATIVE
Glucose, UA: NEGATIVE mg/dL
Hgb urine dipstick: NEGATIVE
Ketones, ur: NEGATIVE mg/dL
Nitrite: NEGATIVE
Protein, ur: 30 mg/dL — AB
Specific Gravity, Urine: 1.028 (ref 1.005–1.030)
pH: 6 (ref 5.0–8.0)

## 2019-04-03 LAB — POCT FERN TEST: POCT Fern Test: NEGATIVE

## 2019-04-03 NOTE — MAU Note (Addendum)
Pt states she hasn't been at prenatal visit since August. Has not felt baby move since September. Has prenatal visit December 7th, unsure who with. Has pain "at the bottom of my stomach that lasts all day."  Had watery discharge once yesterday and twice today. Denies bleeding. Hx of preterm delivery

## 2019-04-03 NOTE — MAU Note (Signed)
Pt given list of OB providers.

## 2019-04-03 NOTE — MAU Provider Note (Signed)
Chief Complaint: Decreased Fetal Movement   First Provider Initiated Contact with Patient 04/03/19 1526     SUBJECTIVE HPI: Angela Huber is a 28 y.o. [id number] at [redacted]w[redacted]d who presents to Maternity Admissions reporting not feeling fetal mvmt for over a month, pelvic pain x a few weeks and one episode of leaking clear fluid yesterday and today. Hx PPROM at ~32 weeks.   Hasn's started prenatal care. States she has NOB appt 12/7. Nervous about Hx PPROM.   Location: pelvic, SP area Quality: pressure Severity: moderate  Duration: few weeks Context: [redacted] weeks gestation Timing: constant Modifying factors: None. Hasn't tried anything.  Associated signs and symptoms: Pos for vaginal discharge, ?LOF, dysuria. Neg for fever, chills, N/V/D/C, hematuria, vaginal bleeding, urgency, frequency.   Past Medical History:  Diagnosis Date  . Anemia   . Asthma   . GERD (gastroesophageal reflux disease)    OB History  Gravida Para Term Preterm AB Living  5 3 2 1 1 3   SAB TAB Ectopic Multiple Live Births  1     0 3    # Outcome Date GA Lbr Len/2nd Weight Sex Delivery Anes PTL Lv  5 Current           4 Term 07/31/18 [redacted]w[redacted]d 00:21 / 00:12 2676 g F Vag-Spont EPI  LIV  3 SAB 06/06/17          2 Preterm 06/23/14     Vag-Spont   LIV  1 Term 08/16/13     Vag-Spont   LIV   Past Surgical History:  Procedure Laterality Date  . KNEE CARTILAGE SURGERY Right    Social History   Socioeconomic History  . Marital status: Married    Spouse name: Not on file  . Number of children: Not on file  . Years of education: Not on file  . Highest education level: Not on file  Occupational History  . Not on file  Social Needs  . Financial resource strain: Not on file  . Food insecurity    Worry: Not on file    Inability: Not on file  . Transportation needs    Medical: Not on file    Non-medical: Not on file  Tobacco Use  . Smoking status: Never Smoker  . Smokeless tobacco: Never Used  Substance and Sexual  Activity  . Alcohol use: Not Currently  . Drug use: Never  . Sexual activity: Yes  Lifestyle  . Physical activity    Days per week: Not on file    Minutes per session: Not on file  . Stress: Not on file  Relationships  . Social Herbalist on phone: Not on file    Gets together: Not on file    Attends religious service: Not on file    Active member of club or organization: Not on file    Attends meetings of clubs or organizations: Not on file    Relationship status: Not on file  . Intimate partner violence    Fear of current or ex partner: Not on file    Emotionally abused: Not on file    Physically abused: Not on file    Forced sexual activity: Not on file  Other Topics Concern  . Not on file  Social History Narrative  . Not on file   History reviewed. No pertinent family history. No current facility-administered medications on file prior to encounter.    Current Outpatient Medications on File Prior to Encounter  Medication Sig  Dispense Refill  . Prenatal Vit-Fe Fumarate-FA (MULTIVITAMIN-PRENATAL) 27-0.8 MG TABS tablet Take 1 tablet by mouth daily at 12 noon.    . senna-docusate (SENOKOT-S) 8.6-50 MG tablet Take 2 tablets by mouth daily. (Patient not taking: Reported on 08/15/2018) 30 tablet 0  . sertraline (ZOLOFT) 25 MG tablet Take 1 tablet (25 mg total) by mouth daily. 30 tablet 0  . traZODone (DESYREL) 50 MG tablet Take 1 tablet (50 mg total) by mouth at bedtime and may repeat dose one time if needed. 30 tablet 0   Allergies  Allergen Reactions  . Sulfa Antibiotics Hives    I have reviewed patient's Past Medical Hx, Surgical Hx, Family Hx, Social Hx, medications and allergies.   Review of Systems  Constitutional: Negative for appetite change, chills and fever.  Gastrointestinal: Positive for abdominal pain. Negative for blood in stool, constipation, diarrhea, nausea and vomiting.  Genitourinary: Positive for dysuria, pelvic pain and vaginal discharge.  Negative for difficulty urinating, flank pain, frequency, hematuria, urgency and vaginal bleeding.  Musculoskeletal: Negative for back pain.    OBJECTIVE Patient Vitals for the past 24 hrs:  BP Temp Temp src Pulse Resp SpO2 Height Weight  04/03/19 1459 134/68 98.3 F (36.8 C) Oral 88 - - - -  04/03/19 1423 108/63 - - (!) 102 18 100 % 5' 3.5" (1.613 m) 99.5 kg   Constitutional: Well-developed, well-nourished female in no acute distress.  Cardiovascular: normal rate Respiratory: normal rate and effort.  GI: Abd soft, non-tender, gravid appropriate for gestational age. No guarding or rebound tenderness.  MS: Extremities nontender, no edema, normal ROM Neurologic: Alert and oriented x 4.  GU: Neg CVAT.  SPECULUM EXAM: NEFG, small amount of thin, white mildly malodorous discharge, no blood noted, neg pooling, cervix w/ Nml ectropion. Visually slightly open. No BOW seen.   BIMANUAL: cervix FT/long; uterus 20-week size, no adnexal tenderness or masses. No CMT.  FHR 135 by doppler.   LAB RESULTS Results for orders placed or performed during the hospital encounter of 04/03/19 (from the past 24 hour(s))  Urinalysis, Routine w reflex microscopic     Status: Abnormal   Collection Time: 04/03/19  2:40 PM  Result Value Ref Range   Color, Urine AMBER (A) YELLOW   APPearance HAZY (A) CLEAR   Specific Gravity, Urine 1.028 1.005 - 1.030   pH 6.0 5.0 - 8.0   Glucose, UA NEGATIVE NEGATIVE mg/dL   Hgb urine dipstick NEGATIVE NEGATIVE   Bilirubin Urine NEGATIVE NEGATIVE   Ketones, ur NEGATIVE NEGATIVE mg/dL   Protein, ur 30 (A) NEGATIVE mg/dL   Nitrite NEGATIVE NEGATIVE   Leukocytes,Ua TRACE (A) NEGATIVE   RBC / HPF 0-5 0 - 5 RBC/hpf   WBC, UA 6-10 0 - 5 WBC/hpf   Bacteria, UA FEW (A) NONE SEEN   Squamous Epithelial / LPF 0-5 0 - 5   Mucus PRESENT   Wet prep, genital     Status: Abnormal   Collection Time: 04/03/19  3:18 PM   Specimen: PATH Cytology Cervicovaginal Ancillary Only; Genital   Result Value Ref Range   Yeast Wet Prep HPF POC NONE SEEN NONE SEEN   Trich, Wet Prep NONE SEEN NONE SEEN   Clue Cells Wet Prep HPF POC NONE SEEN NONE SEEN   WBC, Wet Prep HPF POC MANY (A) NONE SEEN   Sperm NONE SEEN   POCT fern test     Status: Normal   Collection Time: 04/03/19  3:55 PM  Result Value Ref Range  POCT Fern Test Negative = intact amniotic membranes     IMAGING [place] Mfm Ob Transvaginal  Result Date: 04/03/2019 ----------------------------------------------------------------------  OBSTETRICS REPORT                       (Signed Final 04/03/2019 05:38 pm) ---------------------------------------------------------------------- Patient Info  ID #:       [id number]                          D.O.B.:  Sep 08, 1990 (28 yrs)  Name:       Angela Huber                 Visit Date: 04/03/2019 04:57 pm ---------------------------------------------------------------------- Performed By  Performed By:     Marcellina Millin          Ref. Address:     [address]                    [facility]                                                             Rd                                                             Jacky Kindle                                                             [phone]  Attending:        Noralee Space MD        Secondary Phy.:   MAU Nursing-                                                             MAU/Triage  Referred By:      Dorathy Kinsman         [facility] ---------------------------------------------------------------------- Orders   #  Description                          Code         Ordered By   1  [place] MFM OB TRANSVAGINAL               [phone]  Dorathy Kinsman  ----------------------------------------------------------------------   #  Order #                    Accession #                 Episode #   1  [id number]                  [id number]                  [id number]   ---------------------------------------------------------------------- Indications   Pelvic pain affecting pregnancy in second      O26.892   trimester   Weeks of gestation of pregnancy not            Z3A.00   specified   Vaginal discharge during pregnancy in          O26.892   second trimester   Poor obstetric history: Previous preterm       O09.219   delivery, antepartum   Late prenatal care, second trimester           O09.32   Encounter for cervical length                  Z36.86  ---------------------------------------------------------------------- Fetal Evaluation  Num Of Fetuses:         1  Fetal Heart Rate(bpm):  141  Cardiac Activity:       Observed  Presentation:           Cephalic  Placenta:               Anterior  Amniotic Fluid  AFI FV:      Within normal limits                              Largest Pocket(cm)                              6.2 ---------------------------------------------------------------------- OB History  Gravidity:    5         Term:   2        Prem:   1        SAB:   1  TOP:          0       Ectopic:  0        Living: 3 ---------------------------------------------------------------------- Anatomy  Stomach:               Appears normal, left   Bladder:                Appears normal                         sided ---------------------------------------------------------------------- Cervix Uterus Adnexa  Cervix  Length:            4.5  cm.  Normal appearance by transvaginal scan ---------------------------------------------------------------------- Impression  Patient is being evaluated the MAU for decreased fetal  movements.  She has a history of preterm delivery.  No  information on pregnancy dating is available.  Admitted ultrasound was performed.  Amniotic fluid is normal  good fetal activity seen.  On transvaginal ultrasound the  cervix measures 4.5 cm, which is within normal limits. ---------------------------------------------------------------------- Recommendations  -Recommend  fetal anatomy scan appointment after  discharge. ----------------------------------------------------------------------  Noralee Spaceavi Shankar, MD Electronically Signed Final Report   04/03/2019 05:38 pm ----------------------------------------------------------------------   MAU COURSE Orders Placed This Encounter  Procedures  . Wet prep, genital  . OB Urine Culture  . US MFM OB Transvaginal  . Urinalysis, Routine w reflex microscopic  . POCT fern test  . Discharge patient    MDM - Pelvic pain and discharge w/ Neg wet prep, Nml CL, neg fern and Nml fluid of US. Physiologic discharge on SSE. Suspect round ligament pain. No evidence of PPROM, preterm labor or other emergent condition. Will send urine for culture.   ASSESSMENT 1. No leakage of amniotic fluid into vagina   2. History of preterm delivery, currently pregnant   3. Vaginal discharge during pregnancy in second trimester   4. Pelvic pain during pregnancy in second trimester, antepartum     PLAN Discharge home in stable condition. Second trimester precautions Comfort measures.  Follow-up Information    Your Ob/Gyn Follow up on 05/05/2019.   Why: Start prenatal care       [facility] Follow up.   Specialty: Obstetrics and Gynecology Why: as needed in pregnancy emergencies Contact information: [address] [id number] [place] [phone] 330 [phone]         Allergies as of 04/03/2019      Reactions   Sulfa Antibiotics Hives      Medication List    TAKE these medications   multivitamin-prenatal 27-0.8 MG Tabs tablet Take 1 tablet by mouth daily at 12 noon.   senna-docusate 8.6-50 MG tablet Commonly known as: Senokot-S Take 2 tablets by mouth daily.   sertraline 25 MG tablet Commonly known as: ZOLOFT Take 1 tablet (25 mg total) by mouth daily.   traZODone 50 MG tablet Commonly known as: DESYREL Take 1 tablet (50 mg total) by mouth at bedtime and  may repeat dose one time if needed.        Katrinka BlazingSmith, [place], CNM 04/03/2019  5:54 PM

## 2019-04-03 NOTE — Discharge Instructions (Signed)
[place] for [employer] at [facility]       Phone: [phone]  [place] [facility] [employer] at [place]   Phone: [place] for [employer] at [place]  Phone: [place] for [employer] at [facility]  Phone: [place] for [employer] at [place]  Phone: [place] for [place] at [facility]   Phone: [facility]       Phone: [phone]  [facility]    Phone: [phone]   [facility]    Phone: [phone]  [facility]    Phone: [place]    Phone: [phone]  [place] [facility]       Phone: [phone]   [place] [facility]  Phone: [place]    Phone: [phone]  [facility]   Phone: [phone]  [facility]      Phone: [phone]  [facility]    Phone: [phone]    Abdominal Pain During Pregnancy  Abdominal pain is common during pregnancy, and has many possible causes. Some causes are more serious than others, and sometimes the cause is not known. Abdominal pain can be a sign that labor is starting. It can also be caused by normal growth and stretching of muscles and ligaments during pregnancy. Always tell your health care provider if you have any abdominal pain. Follow these instructions at home:  Do not have sex or put anything in your vagina until your pain goes away completely.  Get plenty of rest until your pain improves.  Drink enough fluid to keep your urine pale yellow.  Take over-the-counter and prescription medicines only as told by your health care provider.  Keep all follow-up visits as told by your health care provider. This is  important. Contact a health care provider if:  Your pain continues or gets worse after resting.  You have lower abdominal pain that: ? Comes and goes at regular intervals. ? Spreads to your back. ? Is similar to menstrual cramps.  You have pain or burning when you urinate. Get help right away if:  You have a fever or chills.  You have vaginal bleeding.  You are leaking fluid from your vagina.  You are passing tissue from your vagina.  You have vomiting or diarrhea that lasts for more than 24 hours.  Your baby is moving less than usual.  You feel very weak or faint.  You have shortness of breath.  You develop severe pain in your upper abdomen. Summary  Abdominal pain is common during pregnancy, and has many possible causes.  If you experience abdominal pain during pregnancy, tell your health care provider right away.  Follow your health care provider's home care instructions and keep all follow-up visits as directed. This information is not intended to replace advice given to you by your health care provider. Make sure you discuss any questions you have with your health care provider. Document Released: 05/15/2005 Document Revised: 09/02/2018 Document Reviewed: 08/17/2016 Elsevier Patient Education  2020 [employer].

## 2019-04-04 LAB — GC/CHLAMYDIA PROBE AMP (~~LOC~~) NOT AT ARMC
Chlamydia: NEGATIVE
Comment: NEGATIVE
Comment: NORMAL
Neisseria Gonorrhea: NEGATIVE

## 2019-04-05 LAB — CULTURE, OB URINE

## 2020-08-10 IMAGING — US US MFM OB TRANSVAGINAL
1 series · 15 of 23 positions shown · non-contrast
Comparison: none

[Series 1: us mfm ob transvaginal · 23 acquisitions, 15 frames shown]
[im 1/23]
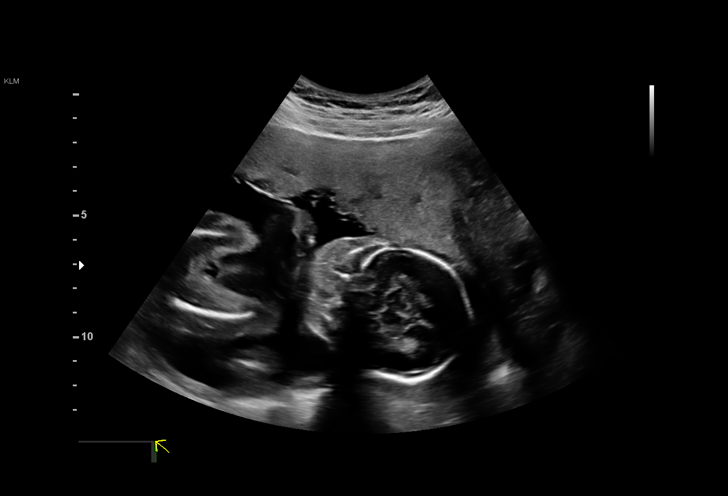
[im 3/23]
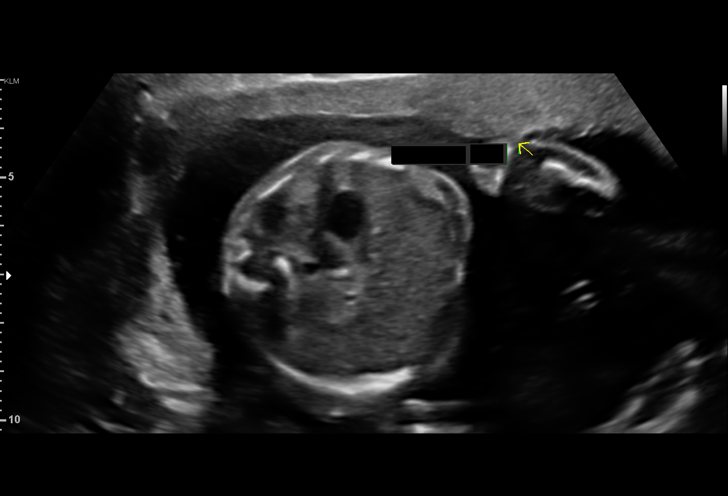
[im 4/23]
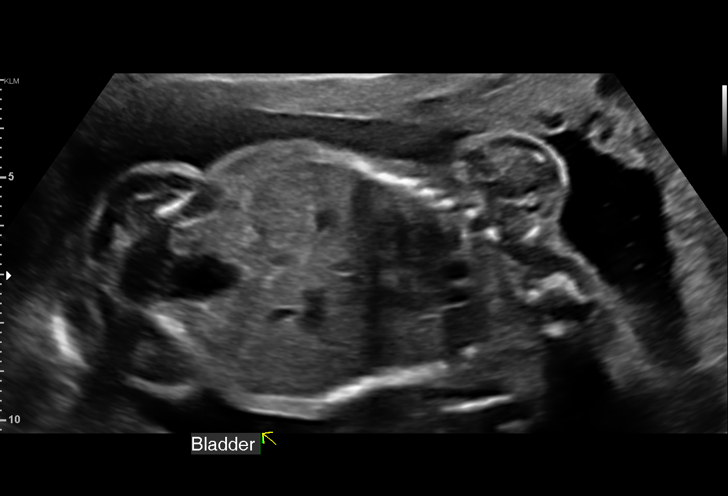
[im 6/23]
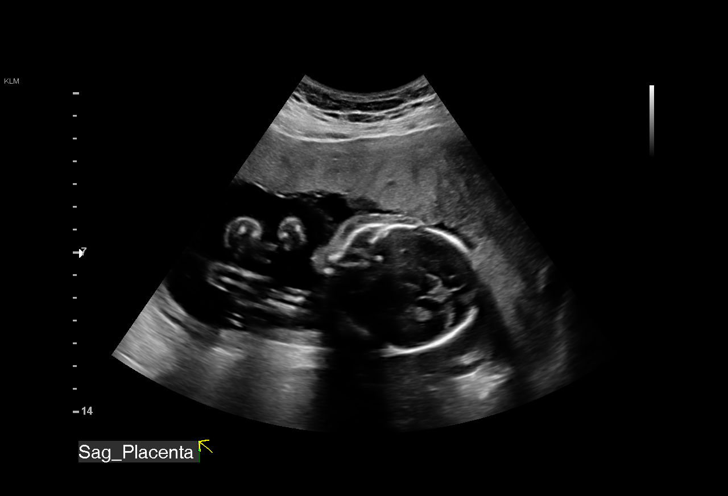
[im 7/23]
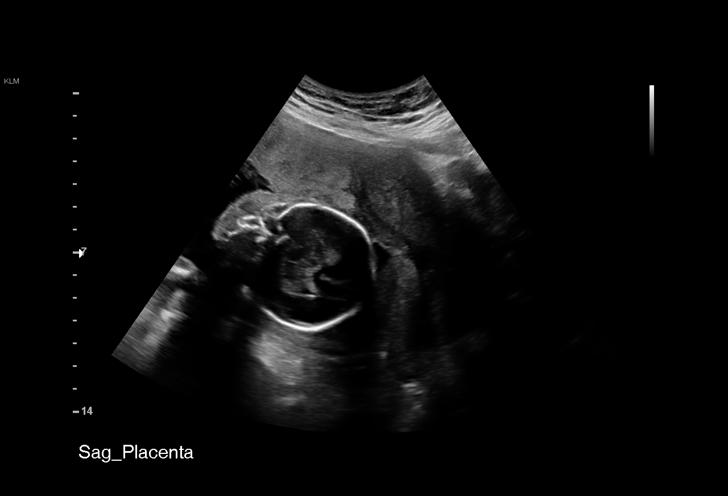
[im 9/23]
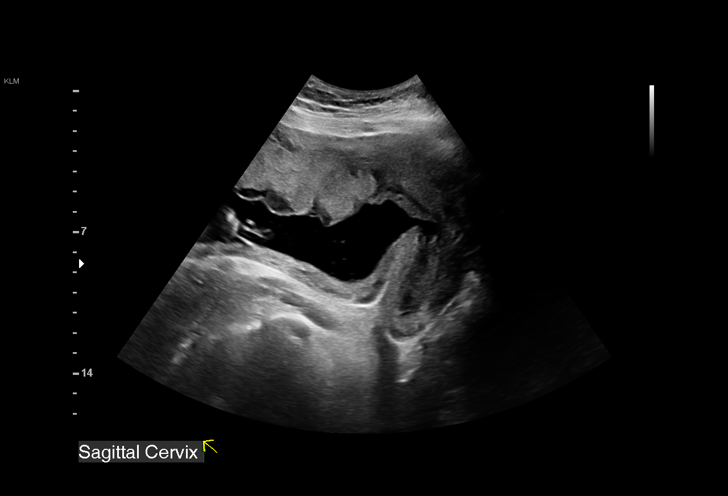
[im 10/23]
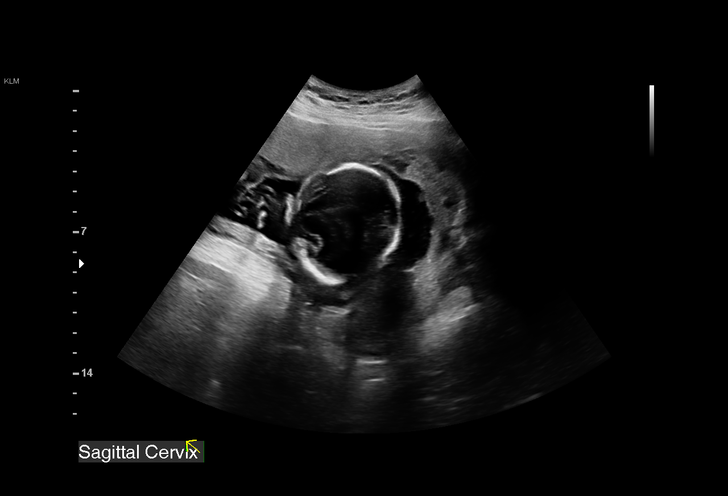
[im 12/23]
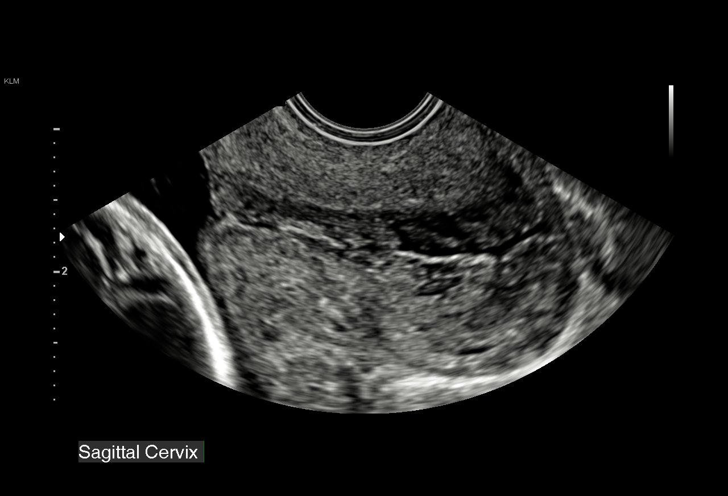
[im 14/23]
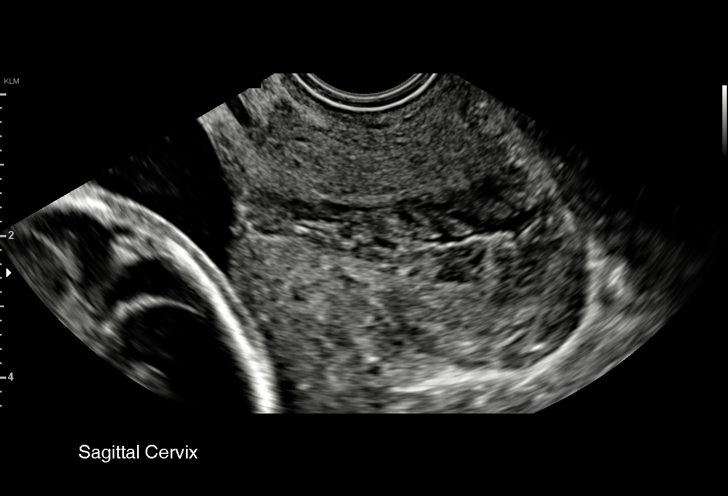
[im 15/23]
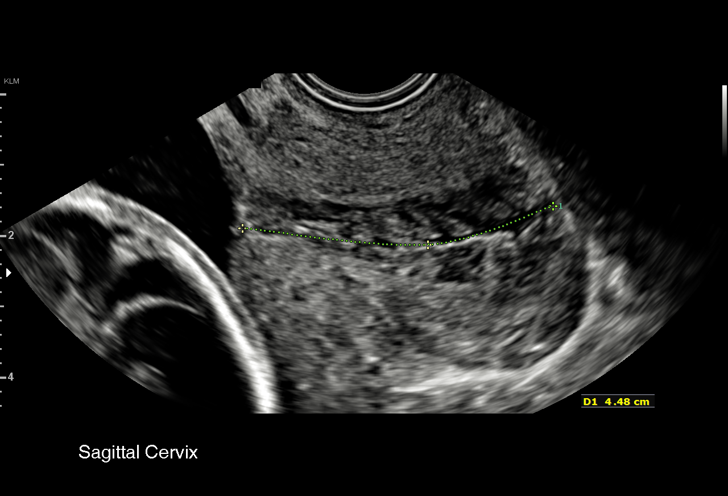
[im 17/23]
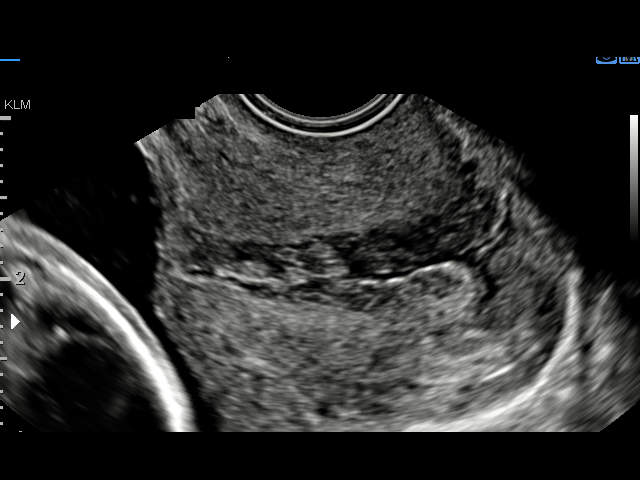
[im 18/23]
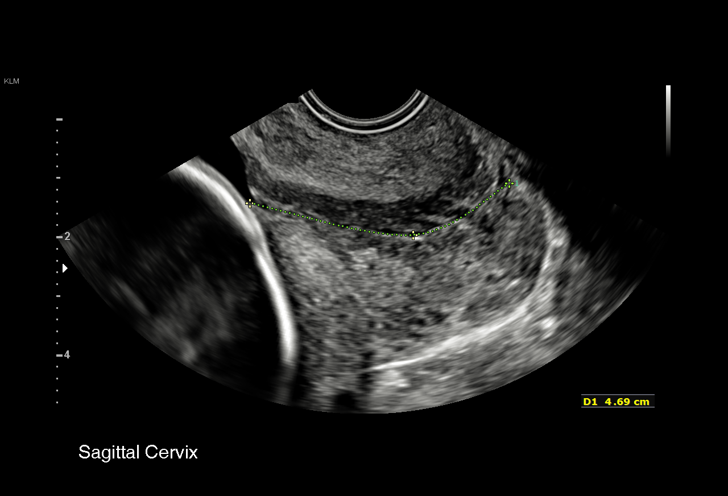
[im 20/23]
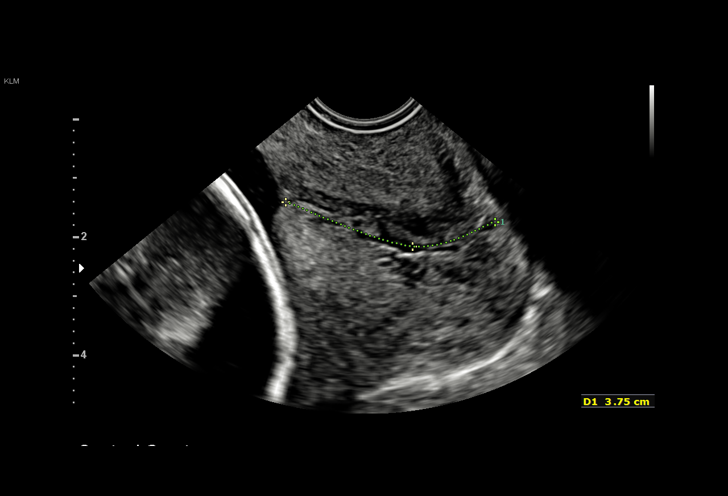
[im 21/23]
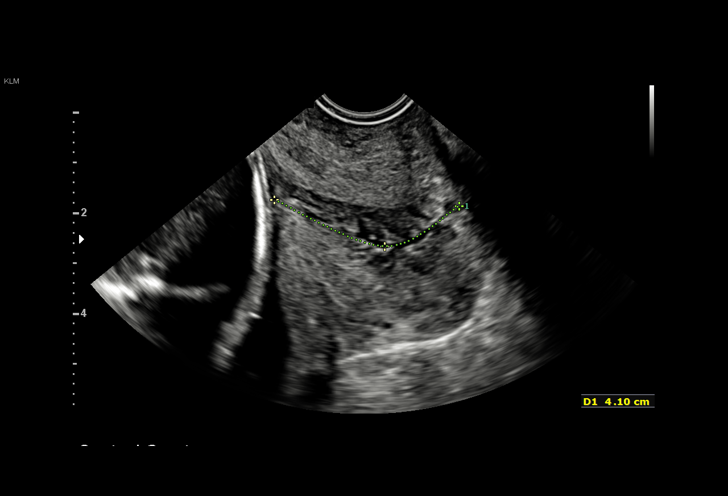
[im 23/23]
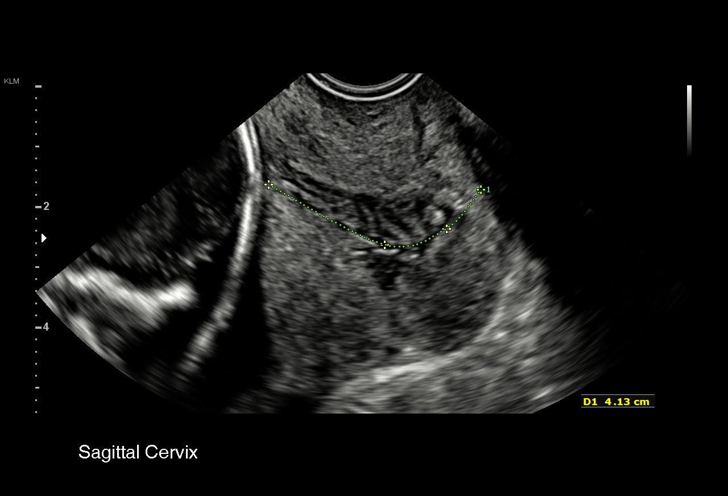

[15 of 23 positions shown; findings below may reference images not displayed]

Attending:        Estrellita Brouwer        Secondary Phy.:   [facility] Nursing-
                                                            MAU/Triage
                   CNM                                      [HOSPITAL]

 ----------------------------------------------------------------------

 ----------------------------------------------------------------------
Indications

  Pelvic pain affecting pregnancy in second
  trimester
  Weeks of gestation of pregnancy not
  specified
  Vaginal discharge during pregnancy in
  second trimester
  Poor obstetric history: Previous preterm
  delivery, antepartum
  Late prenatal care, second trimester
  Encounter for cervical length
 ----------------------------------------------------------------------
Fetal Evaluation

 Num Of Fetuses:         1
 Fetal Heart Rate(bpm):  141
 Cardiac Activity:       Observed
 Presentation:           Cephalic
 Placenta:               Anterior

 Amniotic Fluid
 AFI FV:      Within normal limits

                             Largest Pocket(cm)

OB History

 Gravidity:    5         Term:   2        Prem:   1        SAB:   1
 TOP:          0       Ectopic:  0        Living: 3
Anatomy

 Stomach:               Appears normal, left   Bladder:                Appears normal
                        sided
Cervix Uterus Adnexa

 Cervix
 Length:            4.5  cm.
 Normal appearance by transvaginal scan
Impression

 Patient is being evaluated the [facility] for decreased fetal
 movements.  She has a history of preterm delivery.  No
 information on pregnancy dating is available.
 Admitted ultrasound was performed.  Amniotic fluid is normal
 good fetal activity seen.  On transvaginal ultrasound the
 cervix measures 4.5 cm, which is within normal limits.
Recommendations

 -Recommend fetal anatomy scan appointment after
 discharge.
                 [place], Sang Hwan
